# Patient Record
Sex: Female | Born: 1950 | Race: White | Hispanic: No | Marital: Married | Smoking: Never smoker
Health system: Southern US, Community
[De-identification: ages and names within clinical notes are randomized; demographics above are authoritative.]

## PROBLEM LIST (undated history)

## (undated) DIAGNOSIS — K429 Umbilical hernia without obstruction or gangrene: Secondary | ICD-10-CM

## (undated) DIAGNOSIS — E039 Hypothyroidism, unspecified: Secondary | ICD-10-CM

## (undated) DIAGNOSIS — J45909 Unspecified asthma, uncomplicated: Secondary | ICD-10-CM

## (undated) DIAGNOSIS — F329 Major depressive disorder, single episode, unspecified: Secondary | ICD-10-CM

## (undated) DIAGNOSIS — F419 Anxiety disorder, unspecified: Secondary | ICD-10-CM

## (undated) DIAGNOSIS — D589 Hereditary hemolytic anemia, unspecified: Secondary | ICD-10-CM

## (undated) DIAGNOSIS — K219 Gastro-esophageal reflux disease without esophagitis: Secondary | ICD-10-CM

## (undated) DIAGNOSIS — F32A Depression, unspecified: Secondary | ICD-10-CM

## (undated) DIAGNOSIS — C801 Malignant (primary) neoplasm, unspecified: Secondary | ICD-10-CM

## (undated) DIAGNOSIS — F319 Bipolar disorder, unspecified: Secondary | ICD-10-CM

## (undated) DIAGNOSIS — D649 Anemia, unspecified: Secondary | ICD-10-CM

## (undated) DIAGNOSIS — Z9289 Personal history of other medical treatment: Secondary | ICD-10-CM

## (undated) DIAGNOSIS — G4730 Sleep apnea, unspecified: Secondary | ICD-10-CM

## (undated) DIAGNOSIS — M549 Dorsalgia, unspecified: Secondary | ICD-10-CM

## (undated) DIAGNOSIS — J189 Pneumonia, unspecified organism: Secondary | ICD-10-CM

## (undated) DIAGNOSIS — R600 Localized edema: Secondary | ICD-10-CM

## (undated) DIAGNOSIS — K829 Disease of gallbladder, unspecified: Secondary | ICD-10-CM

## (undated) HISTORY — DX: Umbilical hernia without obstruction or gangrene: K42.9

## (undated) HISTORY — DX: Bipolar disorder, unspecified: F31.9

## (undated) HISTORY — PX: BREAST CYST EXCISION: SHX579

## (undated) HISTORY — PX: OTHER SURGICAL HISTORY: SHX169

## (undated) HISTORY — DX: Dorsalgia, unspecified: M54.9

## (undated) HISTORY — PX: TUBAL LIGATION: SHX77

## (undated) HISTORY — PX: BREAST SURGERY: SHX581

## (undated) HISTORY — DX: Major depressive disorder, single episode, unspecified: F32.9

## (undated) HISTORY — DX: Gastro-esophageal reflux disease without esophagitis: K21.9

## (undated) HISTORY — DX: Sleep apnea, unspecified: G47.30

## (undated) HISTORY — DX: Anxiety disorder, unspecified: F41.9

## (undated) HISTORY — DX: Localized edema: R60.0

## (undated) HISTORY — DX: Depression, unspecified: F32.A

## (undated) HISTORY — DX: Anemia, unspecified: D64.9

## (undated) HISTORY — PX: JOINT REPLACEMENT: SHX530

## (undated) HISTORY — DX: Malignant (primary) neoplasm, unspecified: C80.1

## (undated) HISTORY — DX: Disease of gallbladder, unspecified: K82.9

## (undated) HISTORY — DX: Hypothyroidism, unspecified: E03.9

---

## 1975-10-23 HISTORY — PX: OTHER SURGICAL HISTORY: SHX169

## 2002-07-14 ENCOUNTER — Encounter: Admission: RE | Admit: 2002-07-14 | Discharge: 2002-07-14 | Payer: Self-pay | Admitting: Family Medicine

## 2002-07-14 ENCOUNTER — Encounter: Payer: Self-pay | Admitting: Family Medicine

## 2003-06-22 ENCOUNTER — Encounter: Admission: RE | Admit: 2003-06-22 | Discharge: 2003-06-22 | Payer: Self-pay | Admitting: Family Medicine

## 2003-06-22 ENCOUNTER — Encounter: Payer: Self-pay | Admitting: Family Medicine

## 2004-06-02 ENCOUNTER — Emergency Department (HOSPITAL_COMMUNITY): Admission: EM | Admit: 2004-06-02 | Discharge: 2004-06-03 | Payer: Self-pay | Admitting: Emergency Medicine

## 2004-09-15 ENCOUNTER — Encounter: Admission: RE | Admit: 2004-09-15 | Discharge: 2004-09-15 | Payer: Self-pay | Admitting: Family Medicine

## 2005-02-13 ENCOUNTER — Encounter: Admission: RE | Admit: 2005-02-13 | Discharge: 2005-02-13 | Payer: Self-pay | Admitting: Family Medicine

## 2005-02-19 ENCOUNTER — Other Ambulatory Visit: Admission: RE | Admit: 2005-02-19 | Discharge: 2005-02-19 | Payer: Self-pay | Admitting: Family Medicine

## 2005-08-06 ENCOUNTER — Ambulatory Visit (HOSPITAL_COMMUNITY): Admission: RE | Admit: 2005-08-06 | Discharge: 2005-08-06 | Payer: Self-pay | Admitting: Orthopedic Surgery

## 2005-08-06 ENCOUNTER — Ambulatory Visit (HOSPITAL_BASED_OUTPATIENT_CLINIC_OR_DEPARTMENT_OTHER): Admission: RE | Admit: 2005-08-06 | Discharge: 2005-08-06 | Payer: Self-pay | Admitting: Orthopedic Surgery

## 2006-07-17 ENCOUNTER — Emergency Department (HOSPITAL_COMMUNITY): Admission: EM | Admit: 2006-07-17 | Discharge: 2006-07-18 | Payer: Self-pay | Admitting: Emergency Medicine

## 2006-09-05 ENCOUNTER — Other Ambulatory Visit: Admission: RE | Admit: 2006-09-05 | Discharge: 2006-09-05 | Payer: Self-pay | Admitting: Family Medicine

## 2008-02-16 ENCOUNTER — Encounter: Admission: RE | Admit: 2008-02-16 | Discharge: 2008-02-16 | Payer: Self-pay | Admitting: Family Medicine

## 2008-04-26 ENCOUNTER — Inpatient Hospital Stay (HOSPITAL_COMMUNITY): Admission: RE | Admit: 2008-04-26 | Discharge: 2008-05-01 | Payer: Self-pay | Admitting: Orthopedic Surgery

## 2008-11-22 ENCOUNTER — Inpatient Hospital Stay (HOSPITAL_COMMUNITY): Admission: RE | Admit: 2008-11-22 | Discharge: 2008-11-26 | Payer: Self-pay | Admitting: Orthopedic Surgery

## 2009-03-31 ENCOUNTER — Other Ambulatory Visit: Admission: RE | Admit: 2009-03-31 | Discharge: 2009-03-31 | Payer: Self-pay | Admitting: Family Medicine

## 2009-04-14 ENCOUNTER — Encounter: Admission: RE | Admit: 2009-04-14 | Discharge: 2009-04-14 | Payer: Self-pay | Admitting: Family Medicine

## 2010-11-12 ENCOUNTER — Encounter: Payer: Self-pay | Admitting: Family Medicine

## 2011-02-05 LAB — DIFFERENTIAL
Basophils Absolute: 0 10*3/uL (ref 0.0–0.1)
Basophils Relative: 1 % (ref 0–1)
Eosinophils Absolute: 0.1 10*3/uL (ref 0.0–0.7)
Eosinophils Relative: 1 % (ref 0–5)
Lymphocytes Relative: 37 % (ref 12–46)
Lymphs Abs: 2.2 10*3/uL (ref 0.7–4.0)
Monocytes Absolute: 0.4 10*3/uL (ref 0.1–1.0)
Monocytes Relative: 8 % (ref 3–12)
Neutro Abs: 3.2 10*3/uL (ref 1.7–7.7)
Neutrophils Relative %: 55 % (ref 43–77)

## 2011-02-05 LAB — URINALYSIS, ROUTINE W REFLEX MICROSCOPIC
Glucose, UA: NEGATIVE mg/dL
Hgb urine dipstick: NEGATIVE
Ketones, ur: 15 mg/dL — AB
Nitrite: NEGATIVE
Protein, ur: 30 mg/dL — AB
Specific Gravity, Urine: 1.046 — ABNORMAL HIGH (ref 1.005–1.030)
Urobilinogen, UA: 1 mg/dL (ref 0.0–1.0)
pH: 6 (ref 5.0–8.0)

## 2011-02-05 LAB — BASIC METABOLIC PANEL
BUN: 27 mg/dL — ABNORMAL HIGH (ref 6–23)
CO2: 28 mEq/L (ref 19–32)
Calcium: 9.1 mg/dL (ref 8.4–10.5)
Chloride: 101 mEq/L (ref 96–112)
Creatinine, Ser: 0.74 mg/dL (ref 0.4–1.2)
GFR calc Af Amer: >60 mL/min (ref >60)
GFR calc non Af Amer: >60 mL/min (ref >60)
Glucose, Bld: 100 mg/dL — ABNORMAL HIGH (ref 70–99)
Potassium: 4.8 mEq/L (ref 3.5–5.1)
Sodium: 139 mEq/L (ref 135–145)

## 2011-02-05 LAB — TYPE AND SCREEN
ABO/RH(D): A POS
Antibody Screen: NEGATIVE

## 2011-02-05 LAB — APTT: aPTT: 28 seconds (ref 24–37)

## 2011-02-05 LAB — CBC
HCT: 38.8 % (ref 36.0–46.0)
Hemoglobin: 12.9 g/dL (ref 12.0–15.0)
MCHC: 33.3 g/dL (ref 30.0–36.0)
MCV: 95 fL (ref 78.0–100.0)
Platelets: 253 10*3/uL (ref 150–400)
RBC: 4.09 MIL/uL (ref 3.87–5.11)
RDW: 13.4 % (ref 11.5–15.5)
WBC: 5.9 10*3/uL (ref 4.0–10.5)

## 2011-02-05 LAB — URINE MICROSCOPIC-ADD ON

## 2011-02-05 LAB — PROTIME-INR
INR: 1 (ref 0.00–1.49)
Prothrombin Time: 12.8 seconds (ref 11.6–15.2)

## 2011-02-06 LAB — BASIC METABOLIC PANEL
BUN: 10 mg/dL (ref 6–23)
BUN: 7 mg/dL (ref 6–23)
BUN: 9 mg/dL (ref 6–23)
CO2: 31 mEq/L (ref 19–32)
CO2: 32 mEq/L (ref 19–32)
CO2: 32 mEq/L (ref 19–32)
Calcium: 8.1 mg/dL — ABNORMAL LOW (ref 8.4–10.5)
Calcium: 8.5 mg/dL (ref 8.4–10.5)
Chloride: 102 mEq/L (ref 96–112)
Chloride: 102 mEq/L (ref 96–112)
Chloride: 94 mEq/L — ABNORMAL LOW (ref 96–112)
Creatinine, Ser: 0.52 mg/dL (ref 0.4–1.2)
Creatinine, Ser: 0.69 mg/dL (ref 0.4–1.2)
GFR calc Af Amer: >60 mL/min (ref >60)
GFR calc Af Amer: >60 mL/min (ref >60)
GFR calc non Af Amer: >60 mL/min (ref >60)
GFR calc non Af Amer: >60 mL/min (ref >60)
Glucose, Bld: 107 mg/dL — ABNORMAL HIGH (ref 70–99)
Glucose, Bld: 118 mg/dL — ABNORMAL HIGH (ref 70–99)
Glucose, Bld: 168 mg/dL — ABNORMAL HIGH (ref 70–99)
Potassium: 3.4 mEq/L — ABNORMAL LOW (ref 3.5–5.1)
Potassium: 3.8 mEq/L (ref 3.5–5.1)
Sodium: 134 mEq/L — ABNORMAL LOW (ref 135–145)
Sodium: 139 mEq/L (ref 135–145)

## 2011-02-06 LAB — CROSSMATCH: ABO/RH(D): A POS

## 2011-02-06 LAB — GLUCOSE, CAPILLARY
Glucose-Capillary: 157 mg/dL — ABNORMAL HIGH (ref 70–99)
Glucose-Capillary: 175 mg/dL — ABNORMAL HIGH (ref 70–99)

## 2011-02-06 LAB — BLOOD GAS, ARTERIAL
Acid-Base Excess: 2.5 mmol/L — ABNORMAL HIGH (ref 0.0–2.0)
Bicarbonate: 27.9 mEq/L — ABNORMAL HIGH (ref 20.0–24.0)
Drawn by: 27605
FIO2: 1 %
O2 Saturation: 100 %
Patient temperature: 98.6
TCO2: 29.6 mmol/L (ref 0–100)
pCO2 arterial: 54.2 mmHg — ABNORMAL HIGH (ref 35.0–45.0)
pH, Arterial: 7.332 — ABNORMAL LOW (ref 7.350–7.400)
pO2, Arterial: 300 mmHg — ABNORMAL HIGH (ref 80.0–100.0)

## 2011-02-06 LAB — CBC
HCT: 24 % — ABNORMAL LOW (ref 36.0–46.0)
HCT: 26 % — ABNORMAL LOW (ref 36.0–46.0)
HCT: 37.2 % (ref 36.0–46.0)
Hemoglobin: 10.4 g/dL — ABNORMAL LOW (ref 12.0–15.0)
Hemoglobin: 12.2 g/dL (ref 12.0–15.0)
Hemoglobin: 8.4 g/dL — ABNORMAL LOW (ref 12.0–15.0)
Hemoglobin: 9.1 g/dL — ABNORMAL LOW (ref 12.0–15.0)
MCHC: 32.9 g/dL (ref 30.0–36.0)
MCHC: 35.2 g/dL (ref 30.0–36.0)
MCV: 94.9 fL (ref 78.0–100.0)
MCV: 95.8 fL (ref 78.0–100.0)
MCV: 95.9 fL (ref 78.0–100.0)
Platelets: 200 10*3/uL (ref 150–400)
Platelets: 256 10*3/uL (ref 150–400)
RBC: 2.74 MIL/uL — ABNORMAL LOW (ref 3.87–5.11)
RBC: 3.25 MIL/uL — ABNORMAL LOW (ref 3.87–5.11)
RBC: 3.88 MIL/uL (ref 3.87–5.11)
RDW: 13.2 % (ref 11.5–15.5)
RDW: 13.5 % (ref 11.5–15.5)
WBC: 7.5 10*3/uL (ref 4.0–10.5)
WBC: 8.1 10*3/uL (ref 4.0–10.5)

## 2011-02-06 LAB — PROTIME-INR
INR: 1.1 (ref 0.00–1.49)
INR: 2.4 — ABNORMAL HIGH (ref 0.00–1.49)
Prothrombin Time: 14 seconds (ref 11.6–15.2)
Prothrombin Time: 27.4 seconds — ABNORMAL HIGH (ref 11.6–15.2)

## 2011-02-06 LAB — CARDIAC PANEL(CRET KIN+CKTOT+MB+TROPI)
CK, MB: 3.8 ng/mL (ref 0.3–4.0)
Total CK: 465 U/L — ABNORMAL HIGH (ref 7–177)
Total CK: 649 U/L — ABNORMAL HIGH (ref 7–177)
Total CK: 874 U/L — ABNORMAL HIGH (ref 7–177)
Troponin I: 0.02 ng/mL (ref 0.00–0.06)

## 2011-02-06 LAB — COMPREHENSIVE METABOLIC PANEL
Alkaline Phosphatase: 45 U/L (ref 39–117)
BUN: 9 mg/dL (ref 6–23)
CO2: 27 mEq/L (ref 19–32)
GFR calc non Af Amer: >60 mL/min (ref >60)
Glucose, Bld: 143 mg/dL — ABNORMAL HIGH (ref 70–99)
Potassium: 3.7 mEq/L (ref 3.5–5.1)
Total Protein: 5.7 g/dL — ABNORMAL LOW (ref 6.0–8.3)

## 2011-02-06 LAB — BRAIN NATRIURETIC PEPTIDE: Pro B Natriuretic peptide (BNP): 43 pg/mL (ref 0.0–100.0)

## 2011-02-20 HISTORY — PX: MELANOMA EXCISION: SHX5266

## 2011-03-06 NOTE — Consult Note (Signed)
NAME:  Yolanda, Chavez              ACCOUNT NO.:  [id number]   MEDICAL RECORD NO.:  [id number]          PATIENT TYPE:  INP   LOCATION:  5028                         FACILITY:  [facility]   PHYSICIAN:  Ramiro Harvest, MD    DATE OF BIRTH:  [birth year]   DATE OF CONSULTATION:  11/24/2008  DATE OF DISCHARGE:                                 CONSULTATION   INTERNAL MEDICINE CONSULTATION   REASON FOR CONSULTATION:  Respiratory distress, desaturations and  lethargy.   HISTORY OF PRESENT ILLNESS:  Yolanda Chavez is a 60 year old white  female with history of gastroesophageal reflux disease, hiatal hernia,  hypothyroidism, arthritis, who was admitted on November 22, 2008 for a  right total knee arthroplasty per Dr. Turner Daniels.  After surgery, the patient  was noted to desaturate and was lethargic, which was felt to be  secondary to Dilaudid and anesthesia and responded adequately to Narcan.  On November 24, 2008, per nursing, the patient was difficult to arouse,  had desaturated to 84% on room air with systolic blood pressures of  160/70, heart rate in the low 100s, however, the patient was able to  answer questions appropriately.  When PT/OT worked with the patient, the  patient was noted to desaturate in the 80s with a heart rate in the 160s  and was lethargic.  The patient had been receiving Percocet the day  before, on November 23, 2008 and had previously been on a PCA pump.  The  patient's last dose of Percocet was approximately 10 P.M.  The patient  was given some Narcan again, 0.2 with improvement in her symptoms and  improvement in her saturations and her lethargy and was back to her  baseline by the time of this interview.  We were consulted for further  evaluation and recommendations.  The patient denied any fevers, no  chills, no chest pain, no shortness of breath, no nausea, no vomiting,  no abdominal pain, no diarrhea, no constipation, no focal neurological  symptoms, no cough, no dysuria,  no melena, no hematemesis, no  hematochezia, no other associated symptoms.  The patient was alert and  answering questions and following commands appropriately.   PRIMARY CARE PHYSICIAN:  Dr. Foy Guadalajara of [facility].   ALLERGIES:  No known drug allergies.   PAST MEDICAL HISTORY:  1. Panic attacks.  2. Arthritis.  3. Hiatal hernia.  4. Gastroesophageal reflux disease.  5. Bipolar disorder.  6. Depression.  7. Hypothyroidism.  8. Status post left knee arthroscopy in 2006.  9. Right thumb surgery in 1970s.  10.Cataract, status post surgery, December of 2009.   HOME MEDICATIONS:  1. Tylenol ES 2tablets p.o. b.i.d.  2. Advil 2 tabs p.o. b.i.d.  3. Seroquel 75 mg p.o. nightly.  4. Nexium 40 mg p.r.n.  5. Clomipramine 50 mg q.h.s.  6. Levothyroxine 50 mcg p.o. daily.  7. Depakote 1000 mg p.o. q.h.s.  8. Clonazepam 1.5 mg b.i.d.  9. Arthrotec 75 mg p.o. b.i.d.   HOSPITAL MEDICATIONS:  1. Clomipramine 50 mg p.o. q.h.s.  2. Clonazepam 3 mg p.o. b.i.d.  3. D5 1/2 normal saline 125  mL per hour.  4. Depakote 1 gram nightly.  5. Colace 100 mg p.o. b.i.d.  6. Levothyroxine 50 mcg p.o. daily.  7. Protonix 80 mg p.o. nightly.  8. Seroquel 150 mg p.o. b.i.d.  9. Senna one tablet p.o. b.i.d.  10.Coumadin.   SOCIAL HISTORY:  The patient is married, is retired, is a Environmental consultant at a [employer].  No tobacco use. No alcohol  use.  No IV drug use.   FAMILY HISTORY:  Father with a family history pertinent for  hypertension, coronary artery disease and diabetes.  Has a son with  asthma.   REVIEW OF SYSTEMS:  As per HPI, otherwise negative.   PHYSICAL EXAMINATION:  VITAL SIGNS:  Temperature 99.1, pulse 96, blood  pressure 116/69, respiratory rate 18, saturating 99% on 2L nasal  cannula.  GENERAL:  Patient is alert and following commands appropriately.  HEENT:  Normocephalic, atraumatic.  Pupils equal, round, reactive to  light and accommodation.  Extraocular  movements intact. Oropharynx is  clear.  No lesions, no exudates.  Mildly dry mucous membranes.  RESPIRATORY:  Lungs are clear to auscultation bilaterally.  CARDIOVASCULAR:  Tachycardic, regular rhythm.  ABDOMEN:  Soft, nontender,  nondistended.  Positive bowel sounds.  EXTREMITIES:  No cyanosis, clubbing or edema.  NEUROLOGICAL:  Patient is alert and oriented x3.  Cranial nerves II-XII  are grossly intact.  No focal deficits,  3-4/5 bilateral lower extremity  strength, 5/5 bilateral upper extremity strength.  Cerebellum is intact.  Gait not tested.   LABORATORY DATA:  Pro Time 27.4, INR of 2.4. CBC with white count 9.3,  hemoglobin 10.4, platelet count 225, 000, hematocrit 31.0. EKG with  normal sinus rhythm.  Chest x-ray:  Minimal left basilar atelectasis.  Negative for air space disease or pulmonary edema.   ASSESSMENT AND PLAN:  Yolanda Chavez is a 60 year old female with  history of gastroesophageal reflux disease, hypothyroidism, bipolar  disorder, anxiety, depression who was admitted for right total knee  arthroplasty and who we have consulted for respiratory  distress/lethargy.  1. Respiratory distress/lethargy.  Likely secondary to opioid inducted      versus increased dose of Seroquel and anxiolytics versus pulmonary      embolus (which is unlikely with a therapeutic INR and patient is      asymptomatic) versus pneumonia, which is unlikely as the patient is      afebrile with normal white count, chest x-ray is negative versus      congestive heart failure, which is unlikely with a normal chest x-      ray and patient is asymptomatic.  Will check a comprehensive      metabolic profile, check a BNP, cardiac enzymes.  Will discontinue      the patient's opioids.  Will decrease her Seroquel to her home dose      of 75 mg q.h.s.  Will also decrease her clonazepam to 1.5 mg p.o.      b.i.d.  Will place her on scheduled Tylenol and Ultram as needed      and will follow.  2. Mild  dehydration.  IV fluids.  3. Right total knee arthroplasty per primary team.  4. Anemia, likely a postoperative anemia.  Will follow for now.   It has been a pleasure taking care of Ms. Yolanda Chavez.      Ramiro Harvest, MD  Electronically Signed     DT/MEDQ  D:  11/24/2008  T:  11/25/2008  Job:  [id number]  cc:   Molly Maduro L. Foy Guadalajara, M.D.

## 2011-03-06 NOTE — Op Note (Signed)
NAMEGLENETTA, KIGER              ACCOUNT NO.:  [id number]   MEDICAL RECORD NO.:  [id number]          PATIENT TYPE:  INP   LOCATION:  2899                         FACILITY:  [facility]   PHYSICIAN:  Feliberto Gottron. Turner Daniels, M.D.   DATE OF BIRTH:  Jul 08, 1951   DATE OF PROCEDURE:  11/22/2008  DATE OF DISCHARGE:                               OPERATIVE REPORT   PREOPERATIVE DIAGNOSIS:  End-stage arthritis, right knee.   POSTOPERATIVE DIAGNOSIS:  End-stage arthritis, right knee.   PROCEDURE:  Right total knee arthroplasty using DePuy Sigma RP  components all cemented, a 3 right femur, 2.5 tibia, 10-mm Sigma RP  bearing, 38-mm patellar button.   SURGEON:  Feliberto Gottron. Turner Daniels, MD   FIRST ASSISTANT:  Shirl Harris, PA-C   ANESTHESIA:  Femoral nerve block plus general endotracheal.   ESTIMATED BLOOD LOSS:  Minimal.   FLUID REPLACEMENT:  1500 mL of crystalloid.   DRAINS PLACED:  Foley catheter and 2 medium Hemovacs.   URINE OUTPUT:  300 mL.   TOURNIQUET TIME:  1 hour and 30 minutes.   INDICATIONS FOR PROCEDURE:  A 60 year old woman who has previously had  left total knee a while back and this has done very well, has end-stage  arthritis of the right knee, especially the patellofemoral compartment  where she has bony erosion.  She desires elective right total knee  arthroplasty to decrease pain and increase function.  Risks and benefits  of surgery discussed and are well understood by the patient.   DESCRIPTION OF PROCEDURE:  The patient identified by armband and  underwent right femoral nerve block anesthetic in the holding area at  [facility].  She also received preoperative IV antibiotics and was  taken to operating room 1 where the appropriate anesthetic monitors were  reattached and general endotracheal anesthesia induced with the patient  in supine position.  A Foley catheter was inserted.  Tourniquet applied  high to the right thigh and the right lower extremity prepped and draped  in the usual sterile fashion from the ankle to the tourniquet after  placing a foot positioner and a lateral post.  The limb was wrapped with  an Esmarch bandage and a standard time-out procedure was performed.  The  tourniquet was inflated to 350 mmHg.  We began the operation itself by  making the anterior midline incision centered over the patella starting  a handbreadth above the patella over the midline of the patella and  going 1 cm medial to and 2 cm distal to the tibial tubercle cutting  through the skin and subcutaneous tissue down the level of transverse  retinaculum which was incised in line with the skin incision.  A medial  parapatellar arthrotomy was then accomplished.  Patella was everted.  Prepatellar fat pad was resected.  Superficial medial collateral  ligament was elevated off the anterior, medial, and proximal tibia going  from anterior to posterior and leaving intact distally.  This allowed [name]  to externally rotate the tibia and bring the leg into hyperflexion with  the patella everted, large peripheral osteophytes were removed from  around the femur  and the patella as well as the notch which was quite  stenotic.  The ACL and PCL were then resected.  Posteromedial Z  retractor was placed.  A Mchale retractor through the notch and a Homan  retractor laterally.  The knee was hyperflexed exposing the proximal  tibia which was then entered in the center of the tibia along the axis  of the tibial shaft with the DePuy step drill followed by the IM rod and  a 2-degree posterior sloped proximal tibial cutting guide which was set  for a fairly generous cut because of a 10-15 degrees flexion  contracture.  We removed probably about 10 mm of bone medially and  laterally.  At this point, we entered the distal femur with the step  drill 2 mm anterior to the PCL origin, followed by the IM rod set for 5  degrees right distal femoral cut at 11 mm.  This was pinned into place  and the  distal femoral cut accomplished.  We then measured for a #3  femoral component same size as the contralateral side and pinned this in  place in 0 degrees of external rotation because there was no significant  varus deformity.  A #3 chamfer cutting guide was then screwed into place  over the pins and the anterior, posterior, and chamfer cuts accomplished  without difficulty followed by the Sigma RP box cut.  The patella was  quite eroded requiring basically a freehand cut leaving behind about  probably 14 or 15 mm of bone and a good surface for cementing.  We sized  for a 38 button and drilled the trial.  At this point, the knee was a  hyperflexed.  We sized for a 2.5 tibial baseplate, pinned the trial in  place, applied the smokestack and the reamer, followed by the Delta fin  keel punch.  A #3 left trial femoral component was then hammered into  place, the lugs drilled, and we performed a trial reduction with a 10-mm  spacer.  The knee did come to full extension with good ligamentous  stability in extension, mid flexion, and flexion.  The patella was  subluxing laterally some and because of this a lateral release was  performed and the patella then tracked with no thumb pressure and the  lateral retinaculum was intact.  At this point, all bony surfaces were  water picked, cleaned, and dried with suction and sponges and a double  batch of DePuy HV cement with 1500 mg of Zinacef was mixed and applied  to all bony metallic mating surfaces and in order we hammered into place  a 2.5 tibial baseplate and removed the excess cement, a 3 right femur  and removed excess cement.  A 10-mm Sigma RP bearing was then inserted.  The knee brought to full extension and compression applied as the cement  cured.  The 38-mm patellar button was squeezed into place and excess  cement removed and again the cement allowed to cure.  Medium Hemovac  drains were placed from anterolateral approach and at this point,  the  knee was irrigated out with normal saline solution one more time.  We  checked our patellar tracking one more time.  Parapatellar arthrotomy  was then closed with running #1 Vicryl suture, the subcutaneous tissue  with 0 and 2-0 undyed Vicryl suture and the skin with skin staples.  Dressing of Xeroform, 4 x 4s dressing, sponges, Webril, and Ace wrap  applied.  The tourniquet was let  down.  The patient was then awakened  and taken to the recovery room without difficulty.      Feliberto Gottron. Turner Daniels, M.D.  Electronically Signed     FJR/MEDQ  D:  11/22/2008  T:  11/22/2008  Job:  [id number]

## 2011-03-06 NOTE — Op Note (Signed)
NAME:  Yolanda Chavez, Yolanda Chavez              ACCOUNT NO.:  [id number]   MEDICAL RECORD NO.:  [id number]          PATIENT TYPE:  INP   LOCATION:  5009                         FACILITY:  [facility]   PHYSICIAN:  Feliberto Gottron. Turner Daniels, M.D.   DATE OF BIRTH:  1951/09/25   DATE OF PROCEDURE:  04/26/2008  DATE OF DISCHARGE:                               OPERATIVE REPORT   PREOPERATIVE DIAGNOSIS:  End-stage arthritis of the left knee.   POSTOPERATIVE DIAGNOSIS:  End-stage arthritis of the left knee.   PROCEDURE:  Left total knee arthroplasty using DePuy Sigma RP  components; all cemented, 3 femur, 3 tibia, 10 PS spacer, 38-mm patellar  button, and double batch of DePuy HV cement with 1500 mg of Zinacef.   SURGEON:  Feliberto Gottron. Turner Daniels, MD.   FIRST ASSISTANT:  Shirl Harris, PA-C.   ANESTHETIC:  General endotracheal.   ESTIMATED BLOOD LOSS:  Minimal.   FLUID REPLACEMENT:  800 mL of crystalloid.   DRAINS PLACED:  Two medium Hemovac and a Foley catheter.   TOURNIQUET TIME:  1 hour and 49 minutes.   INDICATIONS FOR PROCEDURE:  A 60 year old woman with end-stage arthritis  of the left knee with a 20-degree flexion contracture and not too much  in the way of varus or valgus deformity.  Arthritis was mainly  patellofemoral and less so in the medial and lateral compartments,  although she is down to bare bone pretty much everywhere.  She has  failed conservative treatment and anti-inflammatory medicines, physical  therapy, narcotics, and desires elective left total knee arthroplasty.  Risks and benefits of surgery discussed, questions answered.   DESCRIPTION OF PROCEDURE:  The patient was identified by armband and in  the preop area received a femoral nerve block left, as well as 2 g of  Ancef IV.  She was then taken to operating room #10 where the  appropriate anesthetic monitors were attached, and general endotracheal  anesthesia induced to the patient in the supine position.  A Foley  catheter was  inserted.  Tourniquet applied high over the left thigh.  Lateral post and foot positioner applied to the table and the left lower  extremity was prepped and draped in usual sterile fashion from the ankle  to the tourniquet.  The limb was then wrapped with an Esmarch bandage,  tourniquet inflated 350 mmHg, and a standard time-out procedure was then  performed.  We then made an anterior midline incision starting  handbreadth above the patella going over the patella 1 cm medial to and  3 cm distal to the tibial tubercle through the skin and subcutaneous  tissue.  Small bleeders were identified and cauterized.  We then incised  the transverse retinaculum over the patella, reflected medially, and  performed a medial parapatellar arthrotomy.  Prepatellar fat pad was  resected.  The superficial medial collateral ligament was elevated off  the proximal tibia from anterior to posterior and then stripped down for  about 3-4 cm.  The patella was then everted, the knee was hyperflexed  fairly impressive peripheral osteophytes from the femur, and the femoral  notch were then  removed as well as osteophytes from the patella and a  couple of fairly good-sized loose bodies coming out of the knee itself.  The knee was then hyperflexed.  The cruciate ligaments were resected.  The anterior one-half of the menisci were resected and the tibia  translated anteriorly.  We used a posteromedial Z-retractor, a Hohmann  retracted laterally, and a McGill retractor through the notch, entered  the proximal tibia with a step drill followed by the IM rod and a 2-  degree posterior slope cutting guide.  We then performed a standard  proximal tibial cut removing an extra 2 mm of bone for about 10 mm in  total because of the flexion contracture.  This was done with the  intramedullary rod and a 2-degree posterior slope cutting guide was then  placed and the IM rod was removed after the guide was pinned.  We then  directed  our attention to the femur, entered the femur 2 mm anterior to  the PCL origin with the intramedullary rod, and 5-degree left distal  femoral cutting guide set for 12 mm.  This was pinned into place and the  distal femoral cut accomplished.  We sized for #3 femoral component  because of the varus even medially and laterally.  We put no rotation  into the pinning and then pinned into place of the anterior/posterior  and chamfer cutting block.  The anterior/posterior and chamfer cuts were  accomplished without difficulty.  Some posterior and superior  osteophytes were then removed off the femur with an osteotome as well as  some more loose bodies.  The box cut was then accomplished without  difficulty.  The patella was measured at 20 mm that had a fair amount of  erosion, so we set the cutting guide at 14 to remove the posterior 67 mm  of the patella as well as peripheral osteophytes sized for 38-mm button  and drilled the patella.  The knee was then once again hyperflexed.  We  sized #3 tibial baseplate, which was pinned into place followed by the  conical reamer and a Deltafit keel punch.  A trial of 3 left femoral  component was then hammered into place and the lugs drilled.  We  performed a trial reduction with a #3 baseplate and a 10 trial PS  spacer, the knee did come to full extension and flexed easily to 135  degrees.  The patellar button tracked normally and then patellar groove  as a trial.  At this point, all trials were removed and the bony  surfaces were water picked clean and dry with suction and sponges.  A  double batch DePuy HV cement with 1500 mg of Zinacef was then mixed and  applied to all bony metallic mating surfaces, except for the posterior  condyles of the femur itself.  In order, we hammered and placed a #3  tibial baseplate removed excess cement, a 3 left femoral component  removed excess cement, and a 38-mm patellar button removed the excess  cement.  As the  cement cured, it was noticed there was some wiggle  between the tibial base plates and the cement mantle with some fluid  coming out and the femur was already hard at this point and the patella  was getting harder at this, so we went ahead and extracted the tibial  component, which did have fluid between and the cement at this point.  We elected to revise the tibial component at this point and  using a  power saw removed a thin layer of cement.  We reamed to the conical  reamer and the Deltafit keel punch and then dried the tibia once again  with suction and sponges.  A single batch of DePuy HV cement was then  mixed, applied to all bony metallic mating surfaces of tibial baseplate,  and a Lonni Fix was hammered in place of being careful to keep the posterior  aspect of the baseplate clean.  After this cement hardened, there was no  toggle noted.  A new 10-mm PS spacer was then hammered, snapped into  place, and the knee once again reduced and taken through range of motion  with excellent tracking.  Medium Hemovac drains were then placed deep in  the wound, which was once again water picked clean.  Parapatellar  arthrotomy was closed with running #1 Vicryl suture.  The subcutaneous  tissue with 0 and 2-0 undyed Vicryl suture and the skin with skin  staples.  A dressing of Xeroform, 4x4 dressing, sponges, Webril, and Ace  wrap was then applied.  The tourniquet was let down.  The patient was  awakened and taken to the recovery room without difficulty.      Feliberto Gottron. Turner Daniels, M.D.  Electronically Signed     FJR/MEDQ  D:  04/26/2008  T:  04/26/2008  Job:  [id number]

## 2011-03-09 NOTE — Discharge Summary (Signed)
NAME:  RYELEIGH, Yolanda Chavez              ACCOUNT NO.:  [id number]   MEDICAL RECORD NO.:  [id number]          PATIENT TYPE:  INP   LOCATION:  5009                         FACILITY:  [facility]   PHYSICIAN:  Feliberto Gottron. Turner Daniels, M.D.   DATE OF BIRTH:  January 17, 1951   DATE OF ADMISSION:  04/26/2008  DATE OF DISCHARGE:  05/01/2008                               DISCHARGE SUMMARY   CHIEF COMPLAINT:  Left knee pain.   HISTORY OF PRESENT ILLNESS:  This is a 60 year old lady who complaints  of unremitting left knee pain despite conservative treatment with  nonsteroidal antiinflammatory drugs, steroid injection, and knee  arthroscopy.  She desires a surgical intervention at this time.  All  risks and benefit of surgery were discussed with the patient.   PAST MEDICAL HISTORY:  Significant for bipolar disorder, hypothyroidism,  and gastroesophageal reflux disease.   PAST SURGICAL HISTORY:  She had a left knee arthroscopy in 2006 and  right thumb surgery in the 1970s.   SOCIAL HISTORY:  She does not drink alcohol.  She is a nonsmoker.  She  is married.   FAMILY HISTORY:  Significant for coronary artery disease and diabetes  mellitus in her father.   ALLERGIES:  She has no known drug allergies.   CURRENT MEDICATIONS:  1. Tylenol 650 mg 1 p.o. daily p.r.n. pain.  2. Imipramine 25 mg 2 tablets p.o. b.i.d.  3. Seroquel 50 mg 1 p.o. t.i.d.  4. Levoxyl 50 mcg 1 p.o. daily.  5. Nexium 40 mg 1 p.o. daily.  6. Valproate sodium 500 mg 2 p.o. in a.m. and 1 p.o. nightly.  7. Arthrotec 75 mg 1 p.o. b.i.d.  8. Clonazepam 1 mg 1 p.o. q.i.d. p.r.n.   PHYSICAL EXAMINATION:  Gross examination of the left knee demonstrates  the patient's range of motion to be 10-90 degrees.  She has a laterally  riding patella and patellofemoral crepitance.  She is neurovascularly  intact.   X-RAYS:  Film from radiograph shows the left knee demonstrates severe  tricompartmental arthritis with extensive spurring in the patellofemoral  compartment.   PREOPERATIVE LABS:  White blood cell 6.5, red blood cell 4.15,  hemoglobin 13.4, hematocrit 39.2, platelets 269, PT 11.9, INR 0.9, and  PTT 28.  Sodium 138, potassium 4.2, chloride 101, glucose 90, BUN 16,  and creatinine 0.72.   Ms. Snethen was admitted to [facility] on April 26, 2008, when she  underwent a left total knee arthroplasty using a DePuy  system.  A  perioperative Foley catheter was placed.  She tolerated the procedure  well and was transferred to the orthopedic floor.  On the first  postoperative day, the patient was awake and alert and tolerating p.o.  intake.  She denied any nausea or vomiting.  Her hemoglobin was found to  be 11.6.  Her Foley catheter was removed.  On the second postoperative  day, the patient was again awake and alert.  Her flexion with the CPM  machine was measured to be 40 degrees.  Her hemoglobin was 10.4.  Her  PCA Dilaudid pump was discontinued.  Physical therapy came  to work with  the patient, but she was unable to participate secondary to grogginess  from her increased pain medication.  On the third postoperative day, the  patient was found to be groggy but arousable and complaining of moderate  left knee pain.  She answered questions appropriately.  She continued to  deny any nausea or vomiting.  Her hemoglobin was 10.1.  Her surgical  dressing was changed.  She walked 10 feet with physical therapy and her  CPM flexion was measured at 50 degrees.  On the fourth postoperative  day, the patient was awake and much more alert.  Her surgical incision  continued to be benign with no evidence of infection.  She walked 12  feet with physical therapy.  On the fifth postoperative day, the patient  was awake and alert, eating well, and ambulating independently with her  walker.  Her CPM machine was set at 60 degrees, and she was discharged  to her home.   DISPOSITION:  The patient was discharged home on May 01, 2008.  [facility] was to manage her Coumadin, physical therapy, and  wound.  Her medications were as per the HMO with the additions of  Percocet 5 mg tablet 1-2 tablets p.o. q.4 h. p.r.n. pain and Coumadin 5  mg tablet to take as directed with a target INR of 1.52.  She will be  weightbearing as tolerated.  She will return to the clinic in 1 week to  see Dr. Turner Daniels.   FINAL DIAGNOSIS:  Left knee end-stage degenerative joint disease.   SECONDARY DIAGNOSIS:  Mild acute blood loss anemia.      Shirl Harris, PA      Feliberto Gottron. Turner Daniels, M.D.  Electronically Signed    JW/MEDQ  D:  05/28/2008  T:  05/29/2008  Job:  [id number]

## 2011-03-09 NOTE — Discharge Summary (Signed)
NAME:  Yolanda Chavez, Yolanda Chavez              ACCOUNT NO.:  [id number]   MEDICAL RECORD NO.:  [id number]          PATIENT TYPE:  INP   LOCATION:  5028                         FACILITY:  [facility]   PHYSICIAN:  Feliberto Gottron. Turner Daniels, M.D.   DATE OF BIRTH:  1950/12/06   DATE OF ADMISSION:  11/22/2008  DATE OF DISCHARGE:  11/26/2008                               DISCHARGE SUMMARY   CHIEF COMPLAINT:  Right knee pain.   HISTORY OF PRESENT ILLNESS:  This is a 60 year old lady who complains of  severe unremitting pain in her right knee despite conservative treatment  with pain medication and steroid injection.  She desires a surgical  intervention at this time.  All risks and benefits of surgery were  discussed with the patient.   PAST MEDICAL HISTORY:  Significant for:  1. Bipolar disorder.  2. GERD.  3. Hypothyroidism.   PAST SURGICAL HISTORY:  1. She has had left knee arthroscopy.  2. Left total knee arthroplasty in 2009.  3. Right thumb surgery.  4. She has also had cataract surgery.   SOCIAL HISTORY:  She denies the use of alcohol or tobacco.  She is  married.   FAMILY HISTORY:  Significant for coronary artery disease and diabetes  mellitus.   ALLERGIES:  She has no known drug allergies.   CURRENT MEDICATIONS:  1. Nexium 40 mg 1 p.o. at bedtime.  2. Clonazepam 10 mg 1 p.o. b.i.d.  3. Tylenol Arthritis 1 p.o. b.i.d.  4. Arthrotec 75 mg 1 p.o. b.i.d.  5. Seroquel 50 mg 1 p.o. b.i.d.  6. Depakote 500 mg 1 p.o. at bedtime.  7. Clomipramine 50 mg 1 p.o. at bedtime.  8. Levoxyl 50 mcg 1 p.o. at bedtime.  9. Hydrocodone 5/500 mg 1 p.o. q.4 h. p.r.n. pain.   PHYSICAL EXAMINATION:  Gross examination of the right knee demonstrates  the patient's range of motion to be approximately 10-90 degrees.  She is  neurovascularly intact.  X-rays demonstrate bone-on-bone degenerative  joint disease of the right knee.   PREOPERATIVE LABORATORIES:  White blood cells 5.9, red blood cells 4.09,  hemoglobin  12.9, hematocrit 38.8, and platelets 253.  PT 12.8, INR 1.0,  and PTT 28.  Sodium 139, potassium 4.8, chloride 101, glucose 100, BUN  27, and creatinine 0.74.  Urinalysis demonstrates small bilirubin with  15 ketones, 30 protein, many bacteria, and hyaline casts.   HOSPITAL COURSE:  [name] was admitted to [facility] on November 22, 2008, where she underwent right total knee arthroplasty.  The  procedure was performed by Dr. Gean Birchwood and the patient tolerated it  well.  Two Hemovac drains were placed into the right knee and  perioperative Foley catheter was placed.  The patient was transferred to  the orthopedic floor and placed on Lovenox and Coumadin for DVT  prophylaxis.  Several hours after surgery, she had decrease oxygen  saturations and became very lethargic.  Her PCA Dilaudid was  discontinued with her mental status and respirations both improved.  On  the first postoperative day, the patient was awake and alert.  Her pain  was fairly well controlled without the PCA.  Hemoglobin was 12.2.  Surgical drain was removed.  She was able to ambulate 50 feet with  physical therapy on the second postoperative day.  The patient was alert  and denied any nausea or vomiting.  She was passing flatus, but not  stool and tolerating p.o. intake well.  Her knee pain was well-  controlled.  Hemoglobin was 10.4.  Surgical dressing was changed and her  incision was benign.  Later that day, she had another episode of  decreased oxygen saturation and somnolence.  Narcan was administered and  an Internal Medicine consult was called, it was determined that her  change in mental status and respiration was likely due to her pain  medication, so her oral pain medicine was changed to tramadol.  On third  postoperative day, the patient was much more alert and was responding  fairly to questioning.  High hemoglobin was 9.1.  Surgical dressing  remained clean and dry.  She was using Tylenol and  tramadol for pain  control.  She was able to ambulate 100 feet with physical therapy.  On  the fourth postoperative day, the patient remained alert.  She was  ambulating well and eating well.  Hemoglobin was 8.4 and the patient  complained of fatigue.  She was transfused with 1 unit of packed red  blood cells and then discharged home.   DISPOSITION:  The patient was discharged home on November 26, 2008.  Home  health would manage her wound, Coumadin and physical therapy.  Should  return to the clinic in 1 week for x-rays and staple removal.   DISCHARGE MEDICINES:  As per the [facility] with the addition of:  1. Tramadol 50 mg 1 p.o. q.4 h. p.r.n. pain.  2. Coumadin to take as directed with a target INR of 1.522.   FINAL DIAGNOSIS:  End-stage degenerative joint disease of the right  knee.   SECONDARY DIAGNOSIS:  Acute blood loss anemia.      Shirl Harris, PA      Feliberto Gottron. Turner Daniels, M.D.  Electronically Signed    JW/MEDQ  D:  01/06/2009  T:  01/07/2009  Job:  [id number]

## 2011-03-09 NOTE — Op Note (Signed)
NAME:  Yolanda Chavez, Yolanda Chavez              ACCOUNT NO.:  [id number]   MEDICAL RECORD NO.:  [id number]          PATIENT TYPE:  AMB   LOCATION:  DSC                          FACILITY:  [facility]   PHYSICIAN:  Feliberto Gottron. Turner Daniels, M.D.   DATE OF BIRTH:  [birth year]   DATE OF PROCEDURE:  08/06/2005  DATE OF DISCHARGE:                                 OPERATIVE REPORT   PREOPERATIVE DIAGNOSIS:  Medial meniscal tear versus chondromalacia medial  femoral condyle of the left knee.   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   PROCEDURE:  Left knee arthroscopic debridement of extensive chondromalacia.   SURGEON:  Feliberto Gottron. Turner Daniels, M.D.   FIRST ASSISTANT:  Dwyane Luo, P.A.S.   ANESTHETIC:  Local with IV sedation.   ESTIMATED BLOOD LOSS:  Minimal.   FLUID REPLACEMENT:  500 mL of crystalloid.   DRAINS PLACED:  None.   TOURNIQUET TIME:  None.   INDICATIONS FOR PROCEDURE:  The patient is a 60 year old woman with some  arthritic changes of the patellofemoral joint as well as the medial, great  and lateral compartment of her left knee. She has mechanical catching,  popping and locking tender over the medial joint line consistent with medial  compartment arthritis versus medial meniscal tear. The patellofemoral  compartment also has extensive arthritis on the sunrise view and she had  some pain under her patella as well. She is failed conservative treatment  with observation, anti-inflammatory medicine and some exercises and now  desires elective arthroscopic evaluation and treatment of her left knee.  Risks and benefits of surgery discussed before today and all questions were  answered.   DESCRIPTION OF PROCEDURE:  The patient identified by armband, taken the  operating room at [facility]. Appropriate anesthetic  monitors were  attached and local anesthesia with IV sedation was induced  into the left knee. The left lower extremity was then prepped and draped in  the usual sterile fashion from the ankle to the mid thigh and we began the  procedure by making standard inferomedial and inferolateral peripatellar  portals allowing introduction of the arthroscope through the inferolateral  portal and the outflow through the inferomedial portal. Diagnostic  arthroscopy revealed grade IV chondromalacia of the lateral facette of the  patella as well as the anterior and distal aspects of the lateral femoral  condyle. The patient had grade III chondromalacia with flap tears on the  medial femoral condyle and this was debrided back to a stable margin with a  3.5 gator sucker shaver as were flap tears along the grade IV areas as well.  The ACL was intact although there were significant notch osteophytes as well  as medial and lateral femoral osteophytes indicative of the arthritic  changes.  Some small bits of cartilage were also noted in the joint fluid  and were washed out during the procedure.  The gutters were cleared medially  and laterally. The menisci were noted to be intact medially  and laterally  and photographic documentation was made of the menisci and the articular  cartilage.  The knee was irrigated out with normal saline solution. The  arthroscopic instruments removed and a dressing of Xeroform 4x4 dressing  sponges, Webril and Ace wrap applied. The patient was then taken to the  recovery room without difficulty.      Feliberto Gottron. Turner Daniels, M.D.  Electronically Signed     FJR/MEDQ  D:  08/06/2005  T:  08/06/2005  Job:  [id number]

## 2011-07-10 ENCOUNTER — Encounter (INDEPENDENT_AMBULATORY_CARE_PROVIDER_SITE_OTHER): Payer: Self-pay | Admitting: General Surgery

## 2011-07-10 ENCOUNTER — Ambulatory Visit (INDEPENDENT_AMBULATORY_CARE_PROVIDER_SITE_OTHER): Payer: BC Managed Care – PPO | Admitting: General Surgery

## 2011-07-10 VITALS — BP 122/74 | HR 78 | Temp 98.6°F | Ht 63.0 in | Wt 244.0 lb

## 2011-07-10 DIAGNOSIS — K429 Umbilical hernia without obstruction or gangrene: Secondary | ICD-10-CM

## 2011-07-10 NOTE — Progress Notes (Signed)
Chief Complaint  Patient presents with  . Other    Eval umb hernia    HPI Yolanda Chavez is a 60 y.o. female.   HPI This is a 60 year old female who went to see a Engineer, petroleum in [place] for evaluation for plastic surgery for pannus. He noted an umbilical hernia at that point and recommended her to have this repaired. She has had an umbilical bulge present for at least a number of months. This doesn't really bother her significantly. She does have some pain in her abdomen as she's doing lifting but I'm not sure this is to do her abdominal wall hernia. She does have some constipation occasionally and describes no nausea or any vomiting. She has a history of bipolar disorder which has been managed very well and has not been hospitalized since 1995. She does have a history of a normal colonoscopy also.  Past Medical History  Diagnosis Date  . Anxiety   . Bipolar 1 disorder   . Depression     Past Surgical History  Procedure Date  . 2 total knee replacements 2009 &2010  . Latareces   . Thumb surgery 1977  . Joint replacement     bilateral knee    History reviewed. No pertinent family history.  Social History History  Substance Use Topics  . Smoking status: Never Smoker   . Smokeless tobacco: Not on file  . Alcohol Use: No    No Known Allergies  Current Outpatient Prescriptions  Medication Sig Dispense Refill  . clomiPRAMINE (ANAFRANIL) 50 MG capsule Take 100 mg by mouth at bedtime.        . clonazePAM (KLONOPIN) 0.5 MG tablet Take 0.5 mg by mouth 2 (two) times daily as needed.        . diclofenac-misoprostol (ARTHROTEC 50) 50-0.2 MG per tablet Take 1 tablet by mouth 2 (two) times daily.        . divalproex (DEPAKOTE SPRINKLE) 125 MG capsule Take 125 mg by mouth 2 (two) times daily.        . QUEtiapine (SEROQUEL) 100 MG tablet Take 100 mg by mouth at bedtime.          Review of Systems Review of Systems  Gastrointestinal: Positive for abdominal pain.  All other  systems reviewed and are negative.    Blood pressure 122/74, pulse 78, temperature 98.6 F (37 C), temperature source Temporal, height 5\' 3"  (1.6 m), weight 244 lb (110.678 kg).  Physical Exam Physical Exam  Constitutional: She appears well-developed and well-nourished.  Neck: Neck supple.  Cardiovascular: Normal rate, regular rhythm and normal heart sounds.   Pulmonary/Chest: Effort normal and breath sounds normal.  Abdominal: Soft. She exhibits no distension and no mass. There is no tenderness. There is no rebound and no guarding. A hernia (reducible nontender umbilical hernia) is present.  Lymphadenopathy:    She has no cervical adenopathy.     Assessment    Umbilical hernia    Plan    We discussed the pathophysiology of umbilical hernia. It is an elective procedure to have this repaired. I discussed that it would likely need to be repaired and I recommended this due to the fact that it would get worse over time as well as the small risk of incarceration and an emergency operation. I discussed open versus laparoscopic repair. I think the best solution for her would be an open repair with mesh. I discussed the risks deemed but not limited to bleeding, infection, recurrence. She understands  and would like to have this done by the end of September. We'll make every effort to do that for her.       Nery Frappier 07/10/2011, 12:12 PM

## 2011-07-10 NOTE — Patient Instructions (Signed)
Booklet given about hernia surgery

## 2011-07-13 ENCOUNTER — Encounter (HOSPITAL_COMMUNITY)
Admission: RE | Admit: 2011-07-13 | Discharge: 2011-07-13 | Disposition: A | Discharge status: Home / Self Care | Payer: BC Managed Care – PPO | Source: Ambulatory Visit | Attending: General Surgery | Admitting: General Surgery

## 2011-07-13 ENCOUNTER — Other Ambulatory Visit (INDEPENDENT_AMBULATORY_CARE_PROVIDER_SITE_OTHER): Payer: Self-pay | Admitting: General Surgery

## 2011-07-13 DIAGNOSIS — K469 Unspecified abdominal hernia without obstruction or gangrene: Secondary | ICD-10-CM

## 2011-07-13 LAB — CBC
MCH: 31.2 pg (ref 26.0–34.0)
MCV: 92 fL (ref 78.0–100.0)
Platelets: 237 10*3/uL (ref 150–400)
RDW: 12.9 % (ref 11.5–15.5)
WBC: 8.4 10*3/uL (ref 4.0–10.5)

## 2011-07-13 LAB — DIFFERENTIAL
Basophils Relative: 0 % (ref 0–1)
Eosinophils Absolute: 0.1 10*3/uL (ref 0.0–0.7)
Eosinophils Relative: 1 % (ref 0–5)
Lymphs Abs: 3.2 10*3/uL (ref 0.7–4.0)
Monocytes Relative: 7 % (ref 3–12)
Neutrophils Relative %: 54 % (ref 43–77)

## 2011-07-13 LAB — BASIC METABOLIC PANEL
Calcium: 9.5 mg/dL (ref 8.4–10.5)
Creatinine, Ser: 0.68 mg/dL (ref 0.50–1.10)
GFR calc Af Amer: >60 mL/min (ref >60)
GFR calc non Af Amer: >60 mL/min (ref >60)
Sodium: 141 mEq/L (ref 135–145)

## 2011-07-13 LAB — SURGICAL PCR SCREEN
MRSA, PCR: NEGATIVE
Staphylococcus aureus: NEGATIVE

## 2011-07-18 ENCOUNTER — Ambulatory Visit (HOSPITAL_COMMUNITY)
Admission: RE | Admit: 2011-07-18 | Discharge: 2011-07-18 | Disposition: A | Discharge status: Home / Self Care | Payer: BC Managed Care – PPO | Source: Ambulatory Visit | Attending: General Surgery | Admitting: General Surgery

## 2011-07-18 DIAGNOSIS — K429 Umbilical hernia without obstruction or gangrene: Secondary | ICD-10-CM

## 2011-07-18 DIAGNOSIS — F319 Bipolar disorder, unspecified: Secondary | ICD-10-CM | POA: Insufficient documentation

## 2011-07-18 DIAGNOSIS — Z01818 Encounter for other preprocedural examination: Secondary | ICD-10-CM | POA: Insufficient documentation

## 2011-07-18 DIAGNOSIS — Z01810 Encounter for preprocedural cardiovascular examination: Secondary | ICD-10-CM | POA: Insufficient documentation

## 2011-07-18 DIAGNOSIS — Z01812 Encounter for preprocedural laboratory examination: Secondary | ICD-10-CM | POA: Insufficient documentation

## 2011-07-19 LAB — CBC
HCT: 39.2
HCT: 28.9 — ABNORMAL LOW
HCT: 29.6 — ABNORMAL LOW
HCT: 33.2 — ABNORMAL LOW
Hemoglobin: 13.4
Hemoglobin: 10.1 — ABNORMAL LOW
Hemoglobin: 10.4 — ABNORMAL LOW
Hemoglobin: 11.6 — ABNORMAL LOW
MCHC: 35.1
MCHC: 35.1
MCV: 94.5
MCV: 95.2
RBC: 4.15
RBC: 3.04 — ABNORMAL LOW
RBC: 3.13 — ABNORMAL LOW
RBC: 3.47 — ABNORMAL LOW
RDW: 13.2
RDW: 13.6
WBC: 6.5
WBC: 7.9

## 2011-07-19 LAB — BASIC METABOLIC PANEL
CO2: 33 — ABNORMAL HIGH
Chloride: 93 — ABNORMAL LOW
Creatinine, Ser: 0.54
GFR calc Af Amer: >60
GFR calc non Af Amer: >60
Glucose, Bld: 90
Potassium: 4.2
Potassium: 3.8
Sodium: 138

## 2011-07-19 LAB — URINE MICROSCOPIC-ADD ON

## 2011-07-19 LAB — PROTIME-INR
INR: 1.8 — ABNORMAL HIGH
Prothrombin Time: 20 — ABNORMAL HIGH
Prothrombin Time: 22.2 — ABNORMAL HIGH

## 2011-07-19 LAB — DIFFERENTIAL
Eosinophils Relative: 1
Lymphocytes Relative: 36
Lymphs Abs: 2.4
Monocytes Absolute: 0.5

## 2011-07-19 LAB — ABO/RH: ABO/RH(D): A POS

## 2011-07-19 LAB — URINALYSIS, ROUTINE W REFLEX MICROSCOPIC
Glucose, UA: NEGATIVE
Nitrite: NEGATIVE
Protein, ur: NEGATIVE
pH: 5.5

## 2011-07-19 NOTE — Op Note (Signed)
NAME:  Yolanda Chavez, Yolanda Chavez              ACCOUNT NO.:  [id number]  MEDICAL RECORD NO.:  [id number]  LOCATION:  SDSC                         FACILITY:  [facility]  PHYSICIAN:  Juanetta Gosling, MDDATE OF BIRTH:  1951-06-18  DATE OF PROCEDURE:  07/18/2011 DATE OF DISCHARGE:                              OPERATIVE REPORT   PREOPERATIVE DIAGNOSIS:  Umbilical hernia.  POSTOPERATIVE DIAGNOSIS:  Umbilical hernia.  PROCEDURE:  Umbilical hernia repair with 6.4 cm Proceed Ventral Patch.  SURGEON:  Juanetta Gosling, MD.  ASSISTANT:  None.  ANESTHESIA:  General.  SPECIMENS:  None.  DRAINS:  None.  COMPLICATIONS:  None.  DISPOSITION:  To recovery room in stable condition.  INDICATIONS:  This is a 60 year old female who is morbidly obese who has sought a Plastic Surgery evaluation for an abdominal plasty and was noted to have an umbilical hernia at that point.  She had an umbilical bulge present for a number of months that had some pain in her abdomen while she is lifting, but I am not entirely sure this is from the abdominal wall hernia.  We did discuss repair.  She was agreeable to that.  We discussed a laparoscopic or an open repair.  I told her I would elect to fix her primary umbilical hernia repair with mesh in an open fashion.  We discussed the risks associated with that procedure. After informed consent was obtained, the patient was taken to the operating room.  She was administered 1 g of intravenous cefazolin. Sequential compression devices were placed in the lower extremities prior to induction with anesthesia.  She was then placed under general endotracheal anesthesia without complication.  Her abdomen was prepped and draped in standard sterile surgical fashion.  A surgical time-out was then performed.  I then made a curvilinear incision with a transverse extension on either side of her umbilicus.  I carried this down to her umbilical stalk. This was divided.  She was  noted to have about 2-cm umbilical hernia present.  This was all easily reducible.  She had no real preperitoneal space, so I needed to place the mesh intraperitoneally.  I used a 6.4 cm Proceed Ventral patch and deployed the bottom layer.  This covered this pretty well and then I tacked the edges to the edges of the fascia without really any tension.  I actually closed the fascia for the most part together with #1 Prolene sutures as well.  This obliterated the defect that the Anesthesia did a Valsalva, and this defect was completely obliterated, and I felt that I got good coverage over the defect.  I then obtained hemostasis.  Following this, I tacked her umbilicus down with a 3-0 Vicryl in two positions.  I then closed her with 3-0 Vicryl, 4-0 Monocryl, Steri-Strips, and sterile dressing were placed overlying this.  She tolerated this well and was extubated in the operating room and transferred to recovery room in stable condition.     Juanetta Gosling, MD     MCW/MEDQ  D:  07/18/2011  T:  07/18/2011  Job:  [id number]  cc:   Molly Maduro L. Foy [place], M.D.  Electronically Signed by Emelia Loron MD on 07/19/2011 05:01:54 PM

## 2011-07-23 HISTORY — PX: HERNIA REPAIR: SHX51

## 2011-08-06 ENCOUNTER — Encounter (INDEPENDENT_AMBULATORY_CARE_PROVIDER_SITE_OTHER): Payer: Self-pay | Admitting: General Surgery

## 2011-08-06 ENCOUNTER — Ambulatory Visit (INDEPENDENT_AMBULATORY_CARE_PROVIDER_SITE_OTHER): Payer: BC Managed Care – PPO | Admitting: General Surgery

## 2011-08-06 VITALS — BP 124/88 | HR 84 | Temp 97.2°F | Resp 16 | Ht 64.0 in | Wt 247.0 lb

## 2011-08-06 DIAGNOSIS — Z09 Encounter for follow-up examination after completed treatment for conditions other than malignant neoplasm: Secondary | ICD-10-CM

## 2011-08-06 NOTE — Progress Notes (Signed)
Subjective:     Patient ID: Yolanda Chavez, female   DOB: [birth year], 60 y.o.   MRN: [id number]  HPI This is a 60 year old female who underwent an umbilical hernia repair with proceed ventral patch about 3 weeks ago. She has been doing heavy lifting and she's taking care of her mother but is otherwise doing very well. She has no real significant complaints associated with this.  Review of Systems     Objective:   Physical Exam Well healing incision without infection or significant seroma    Assessment:     S/p UH repair    Plan:     I released her to full activity and will see her as needed

## 2011-08-20 ENCOUNTER — Telehealth (INDEPENDENT_AMBULATORY_CARE_PROVIDER_SITE_OTHER): Payer: Self-pay

## 2011-08-20 NOTE — Telephone Encounter (Signed)
Called patient to check on her per blue line. She said she is certain she has another hernia. I talked to [facility] and was able to make her an apppointment to see  Dwain Sarna next Monday. Patient said she would call if pain got bad or hernia wasn't reduceable.

## 2011-08-21 ENCOUNTER — Encounter (INDEPENDENT_AMBULATORY_CARE_PROVIDER_SITE_OTHER): Payer: Self-pay | Admitting: General Surgery

## 2011-08-21 ENCOUNTER — Encounter (INDEPENDENT_AMBULATORY_CARE_PROVIDER_SITE_OTHER): Payer: BC Managed Care – PPO | Admitting: Surgery

## 2011-08-21 ENCOUNTER — Ambulatory Visit (INDEPENDENT_AMBULATORY_CARE_PROVIDER_SITE_OTHER): Payer: BC Managed Care – PPO | Admitting: General Surgery

## 2011-08-21 VITALS — BP 134/94 | HR 84 | Temp 99.0°F | Resp 16 | Ht 63.5 in | Wt 239.8 lb

## 2011-08-21 DIAGNOSIS — T8140XA Infection following a procedure, unspecified, initial encounter: Secondary | ICD-10-CM

## 2011-08-21 DIAGNOSIS — T8149XA Infection following a procedure, other surgical site, initial encounter: Secondary | ICD-10-CM

## 2011-08-21 LAB — BASIC METABOLIC PANEL
CO2: 28 mEq/L (ref 19–32)
Calcium: 9.3 mg/dL (ref 8.4–10.5)
Creat: 0.72 mg/dL (ref 0.50–1.10)

## 2011-08-21 MED ORDER — CEPHALEXIN 500 MG PO CAPS: 500.0000 mg | ORAL_CAPSULE | Freq: Two times a day (BID) | ORAL | Status: DC

## 2011-08-21 NOTE — Progress Notes (Signed)
Subjective:     Patient ID: Yolanda Chavez, female   DOB: 1951/01/02, 60 y.o.   MRN: [id number]  HPI This is a 60 year old female who I saw 2 weeks ago and doing well after umbilical hernia repair with mesh. She had no complaints at that time. Since then she has developed drainage from her wound as well as some difficulty with some bloating. She is having bowel movements. She is passing some flatus. She describes some brownish foul-smelling drainage from her umbilicus.  Review of Systems     Objective:   Physical Exam  Constitutional: She appears well-developed and well-nourished.  Abdominal: Soft. There is no tenderness.         Assessment:     Likely SSI of umbilical hernia repair    Plan:        She had really done well immediately and I think she may develop a small seromatous infected now. She is not systemically ill at this point. I am however concern due to the presence of mesh we discussed all of her different options. We discussed oral antibiotics versus admission for IV antibiotics. I think we'll we'll plan on doing this I will put her on Keflex now and obtain a CT scan tomorrow as well as some laboratory evaluation. I will follow up with her tomorrow if this is not quickly improve she can do need to come in the hospital we also discussed possible mesh infection and what that might entail as well she is understanding of this and we will followup quickly.

## 2011-08-22 ENCOUNTER — Other Ambulatory Visit (INDEPENDENT_AMBULATORY_CARE_PROVIDER_SITE_OTHER): Payer: Self-pay | Admitting: General Surgery

## 2011-08-22 ENCOUNTER — Ambulatory Visit
Admission: RE | Admit: 2011-08-22 | Discharge: 2011-08-22 | Disposition: A | Discharge status: Home / Self Care | Payer: BC Managed Care – PPO | Source: Ambulatory Visit | Attending: General Surgery | Admitting: General Surgery

## 2011-08-22 DIAGNOSIS — T8149XA Infection following a procedure, other surgical site, initial encounter: Secondary | ICD-10-CM

## 2011-08-22 LAB — CBC WITH DIFFERENTIAL/PLATELET
Basophils Relative: 1 % (ref 0–1)
HCT: 40.7 % (ref 36.0–46.0)
Hemoglobin: 13.6 g/dL (ref 12.0–15.0)
Lymphocytes Relative: 28 % (ref 12–46)
MCHC: 33.4 g/dL (ref 30.0–36.0)
Monocytes Absolute: 0.5 10*3/uL (ref 0.1–1.0)
Monocytes Relative: 6 % (ref 3–12)
Neutro Abs: 5.2 10*3/uL (ref 1.7–7.7)
Neutrophils Relative %: 63 % (ref 43–77)
WBC: 8.2 10*3/uL (ref 4.0–10.5)

## 2011-08-22 MED ORDER — IOHEXOL 300 MG/ML  SOLN
125.0000 mL | Freq: Once | INTRAMUSCULAR | Status: AC | PRN
  Administered 2011-08-22: 125 mL via INTRAVENOUS

## 2011-08-23 ENCOUNTER — Observation Stay (HOSPITAL_COMMUNITY)
Admission: RE | Admit: 2011-08-23 | Discharge: 2011-08-25 | DRG: 415 | Disposition: A | Discharge status: Home Health | Payer: BC Managed Care – PPO | Source: Ambulatory Visit | Attending: General Surgery | Admitting: General Surgery

## 2011-08-23 DIAGNOSIS — IMO0002 Reserved for concepts with insufficient information to code with codable children: Principal | ICD-10-CM | POA: Insufficient documentation

## 2011-08-23 DIAGNOSIS — F411 Generalized anxiety disorder: Secondary | ICD-10-CM | POA: Insufficient documentation

## 2011-08-23 DIAGNOSIS — Y9210 Unspecified residential institution as the place of occurrence of the external cause: Secondary | ICD-10-CM | POA: Insufficient documentation

## 2011-08-23 DIAGNOSIS — F319 Bipolar disorder, unspecified: Secondary | ICD-10-CM | POA: Insufficient documentation

## 2011-08-23 DIAGNOSIS — Y849 Medical procedure, unspecified as the cause of abnormal reaction of the patient, or of later complication, without mention of misadventure at the time of the procedure: Secondary | ICD-10-CM | POA: Insufficient documentation

## 2011-08-23 DIAGNOSIS — K429 Umbilical hernia without obstruction or gangrene: Secondary | ICD-10-CM | POA: Insufficient documentation

## 2011-08-23 DIAGNOSIS — T8579XA Infection and inflammatory reaction due to other internal prosthetic devices, implants and grafts, initial encounter: Secondary | ICD-10-CM

## 2011-08-23 LAB — CBC
Hemoglobin: 12.4 g/dL (ref 12.0–15.0)
MCHC: 34 g/dL (ref 30.0–36.0)
Platelets: 202 10*3/uL (ref 150–400)
RDW: 12.8 % (ref 11.5–15.5)

## 2011-08-23 LAB — SURGICAL PCR SCREEN
MRSA, PCR: NEGATIVE
Staphylococcus aureus: NEGATIVE

## 2011-08-27 ENCOUNTER — Ambulatory Visit (INDEPENDENT_AMBULATORY_CARE_PROVIDER_SITE_OTHER): Payer: BC Managed Care – PPO | Admitting: General Surgery

## 2011-08-27 ENCOUNTER — Encounter (INDEPENDENT_AMBULATORY_CARE_PROVIDER_SITE_OTHER): Payer: Self-pay | Admitting: General Surgery

## 2011-08-27 VITALS — BP 132/92 | HR 64 | Temp 97.7°F | Resp 18 | Ht 63.5 in | Wt 243.0 lb

## 2011-08-27 DIAGNOSIS — Z09 Encounter for follow-up examination after completed treatment for conditions other than malignant neoplasm: Secondary | ICD-10-CM

## 2011-08-27 MED ORDER — OXYCODONE HCL 5 MG PO TABS: 5.0000 mg | ORAL_TABLET | Freq: Four times a day (QID) | ORAL | Status: AC | PRN

## 2011-08-27 NOTE — Op Note (Signed)
  NAMELYLY, CANIZALES              ACCOUNT NO.:  [id number]  MEDICAL RECORD NO.:  [id number]  LOCATION:  5127                         FACILITY:  [facility]  PHYSICIAN:  Juanetta Gosling, MDDATE OF BIRTH:  Aug 07, 1951  DATE OF PROCEDURE:  08/23/2011 DATE OF DISCHARGE:                              OPERATIVE REPORT   PREOPERATIVE DIAGNOSIS:  Status post umbilical hernia with infected seroma.  POSTOPERATIVE DIAGNOSIS:  Status post umbilical hernia with infected seroma  PROCEDURES: 1. Drainage of infected seroma. 2. Removal of Proceed ventral patch. 3. Primary repair umbilical hernia.  SURGEON:  Juanetta Gosling, MD  ASSISTANT:  None.  ANESTHESIA:  General.  SUPERVISING ANESTHESIOLOGIST:  Germaine Pomfret, MD  SPECIMENS:  Cultures to Microbiology.  ESTIMATED BLOOD LOSS:  Minimal.  COMPLICATIONS:  None.  DRAINS:  None.  DISPOSITION:  The patient to recovery in stable condition.  INDICATION:  This is a 60 year old female who had an umbilical repair with mesh about 7 weeks ago, now who presented to my office on Monday with cellulitis.  I did a CT scan on her.  This confirmed that there was a seroma.  Since then, she has also started draining and I told her we need to go to the operating room and drain this and possibly remove her mesh.  We discussed the risks including recurrence of her hernia that would need repair at a later date.  DESCRIPTION OF PROCEDURE:  After informed consent was obtained, the patient was taken to the operating room.  She was administered 2 g of intravenous cefazolin.  Sequential compression devices were placed on the lower extremities.  She was then placed under general anesthesia without complication.  Her abdomen was prepped and draped in standard sterile surgical fashion.  It had started to drain and this cellulitis had increased significantly since I had last seen her on Monday.  I then probed the old incision.  This all opened really  on its own.  There was a little large amount of infected seroma fluid that was present and had a foul smell.  I took cultures of this and these are pending now.  I evacuated this entire area.  I decided to remove the mesh and I removed it very easily, consistent with her having an infection and it was removed in its entirety.  I then irrigated this copiously until it was as clean as possible.  I did re-close her defect with 2-0 Vicryl sutures as I do not want to place permanent suture in this wound at this time. She understands that she will likely have a hernia in the future and get the wound repaired with a laparoscope.  I would not enter this wound again to repair her hernia.  I then placed Betadine-soaked gauze on this.  She was extubated and transferred to recovery room in stable condition.     Juanetta Gosling, MD     MCW/MEDQ  D:  08/23/2011  T:  08/23/2011  Job:  [id number]  Electronically Signed by Emelia Loron MD on 08/27/2011 01:25:43 PM

## 2011-08-27 NOTE — Progress Notes (Signed)
Subjective:     Patient ID: Yolanda Chavez, female   DOB: [birth year], 60 y.o.   MRN: [id number]  HPI This is a 60 year old female I had done a umbilical hernia repair with mesh. About 6-7 weeks postop she had developed an infected seroma. I decided to take her back to the operating room. I released this infected seroma and then I removed her mesh due to my concern for infection. Culture is not growing anything out yet to this point. She was discharged home and has been doing well. She's doing dressing changes twice daily. She has no fevers. Her appetite has returned to normal.  Review of Systems     Objective:   Physical Exam    open umbilical wound with granulation tissue no erythema or other evidence infection Assessment:     S/p excision of umbilical hernia mesh, drainage seroma    Plan:     She's going to do once daily dressing changes. I told her that there is a good chance or he would return and we will be to repair this laparoscopically. For now we'll just going to get this to heal and then we'll deal with that later. I will see her back in one month.

## 2011-08-28 LAB — ANAEROBIC CULTURE

## 2011-09-06 NOTE — Discharge Summary (Signed)
Yolanda Chavez, Yolanda Chavez              ACCOUNT NO.:  [id number]  MEDICAL RECORD NO.:  [id number]  LOCATION:  5127                         FACILITY:  [facility]  PHYSICIAN:  Juanetta Gosling, MDDATE OF BIRTH:  [birth year]  DATE OF ADMISSION:  08/23/2011 DATE OF DISCHARGE:  08/25/2011                              DISCHARGE SUMMARY   ADMISSION DIAGNOSES: 1. Morbid obesity. 2. Anxiety. 3. Bipolar disorder. 4. Status post umbilical hernia repair recently with wound infection.  DISCHARGE DIAGNOSES: 1. Morbid obesity. 2. Anxiety. 3. Bipolar disorder. 4. Status post umbilical hernia repair recently with wound infection.  PROCEDURES PERFORMED:  On August 23, 2011, drainage of infected seroma and removal of Proceed ventral patch with primary repair of umbilical hernia.  HISTORY AND HOSPITAL COURSE:  This is a 60 year old female who I did an umbilical repair with mesh and then I had seen several weeks after operation, she was doing well.  Since that last visit, where I had released her, she had began having drainage from her wound as well as some difficulty with some bloating.  This looked like a cellulitis with a seroma underneath.  I sent her to get a CAT scan.  She had a very large seroma present.  I talked to her over by phone, and she said this area started to drain, so I have asked her to come in for admission, and we discussed going to the operating room and draining this area and putting on IV antibiotics.  We discussed possibly losing her mesh and to go to the operating room.  She clearly has a lot of murky fluid and I was very concerned about her mesh given her other medical problems and her body habitus, so I did remove her mesh at the same time.  I left the wound open.  She was maintained on antibiotics and then eventually was able to take care of her dressing, tolerating regular diet and was discharged home.  PERTINENT LABORATORY EVALUATION:  White count was 9.6, hematocrit  was 36.5.  DISCHARGE MEDICATIONS:  Quetiapine, Percocet, divalproex, Arthrotec, Colace, clonazepam, clomipramine, as well as Augmentin 875 mg b.i.d.  FOLLOWUP:  With Dr. Dwain Sarna, [facility] in 1 week.     Juanetta Gosling, MD     MCW/MEDQ  D:  09/06/2011  T:  09/06/2011  Job:  [id number]

## 2011-09-25 ENCOUNTER — Ambulatory Visit (INDEPENDENT_AMBULATORY_CARE_PROVIDER_SITE_OTHER): Payer: BC Managed Care – PPO | Admitting: General Surgery

## 2011-09-25 ENCOUNTER — Encounter (INDEPENDENT_AMBULATORY_CARE_PROVIDER_SITE_OTHER): Payer: BC Managed Care – PPO | Admitting: General Surgery

## 2011-09-25 ENCOUNTER — Encounter (INDEPENDENT_AMBULATORY_CARE_PROVIDER_SITE_OTHER): Payer: Self-pay | Admitting: General Surgery

## 2011-09-25 VITALS — BP 132/90 | HR 92 | Temp 97.6°F | Resp 18 | Ht 64.0 in | Wt 239.4 lb

## 2011-09-25 DIAGNOSIS — Z09 Encounter for follow-up examination after completed treatment for conditions other than malignant neoplasm: Secondary | ICD-10-CM

## 2011-09-25 NOTE — Progress Notes (Signed)
Subjective:     Patient ID: Yolanda Chavez, female   DOB: 06/06/1951, 60 y.o.   MRN: [id number]  HPI This is a 60 year old female I did an umbilical hernia repair with mesh on. This was complicated by an infection for which I took her back to the operating room. I thought it best at that time she had a very large seroma with an infection to remove the mesh and primarily repair hernia. She been taking care of her wound at home and this is now healed by secondary intention. She's doing well without any significant complaints. She has an occasional pains at the site of her incision but there is not a bulge that is noticeable there right now at all. She comes in today just for a check. She also asked today about possible weight loss surgery.  Review of Systems     Objective:   Physical Exam Healed umbilical wound without infection or obvious hernia now    Assessment:     S/p UH repair c/b infection and mesh removal     Plan:     She is doing really well. We discussed again that her risk of a recurrent hernia is fairly high. I will plan on seeing her back in 2 months just to relook at this area. She is also like to discuss weight loss surgery and I will refer her to one of our seminars.

## 2011-11-30 ENCOUNTER — Encounter (INDEPENDENT_AMBULATORY_CARE_PROVIDER_SITE_OTHER): Payer: Self-pay | Admitting: General Surgery

## 2011-11-30 ENCOUNTER — Ambulatory Visit (INDEPENDENT_AMBULATORY_CARE_PROVIDER_SITE_OTHER): Payer: BC Managed Care – PPO | Admitting: General Surgery

## 2011-11-30 VITALS — BP 128/82 | HR 76 | Temp 97.9°F | Resp 18 | Ht 63.5 in | Wt 245.4 lb

## 2011-11-30 DIAGNOSIS — Z09 Encounter for follow-up examination after completed treatment for conditions other than malignant neoplasm: Secondary | ICD-10-CM

## 2011-11-30 NOTE — Progress Notes (Signed)
Subjective:     Patient ID: Yolanda Chavez, female   DOB: 07-03-1951, 61 y.o.   MRN: [id number]  HPI This is a 61 year old female I did an umbilical hernia repair with mesh on. This was complicated by an infection for which I took her back to the operating room. I thought it best at that time she had a very large seroma with an infection to remove the mesh and primarily repair her hernia. Her wound is now healed.  She reports no complaints referable to her umbilicus.  Review of Systems     Objective:   Physical Exam Well healed umbilical incision without infection, healed, no hernia noted    Assessment:     S/p uh repair with mesh c/b infection requiring mesh removal and primary repair    Plan:     Return to full activity She is certainly high risk for recurrence and if she notes any symptoms she will call back.

## 2011-11-30 NOTE — Patient Instructions (Signed)
May return to full activity.  Call me back if you notice a lump, bulge or pain at your belly button

## 2012-10-29 IMAGING — CR DG CHEST 2V
2 series · 2 of 2 positions shown · non-contrast
Comparison: 11/24/2008

CLINICAL DATA: Preoperative for hernia repair.

CHEST - 2 VIEW

[view not recorded (1 of 2)]
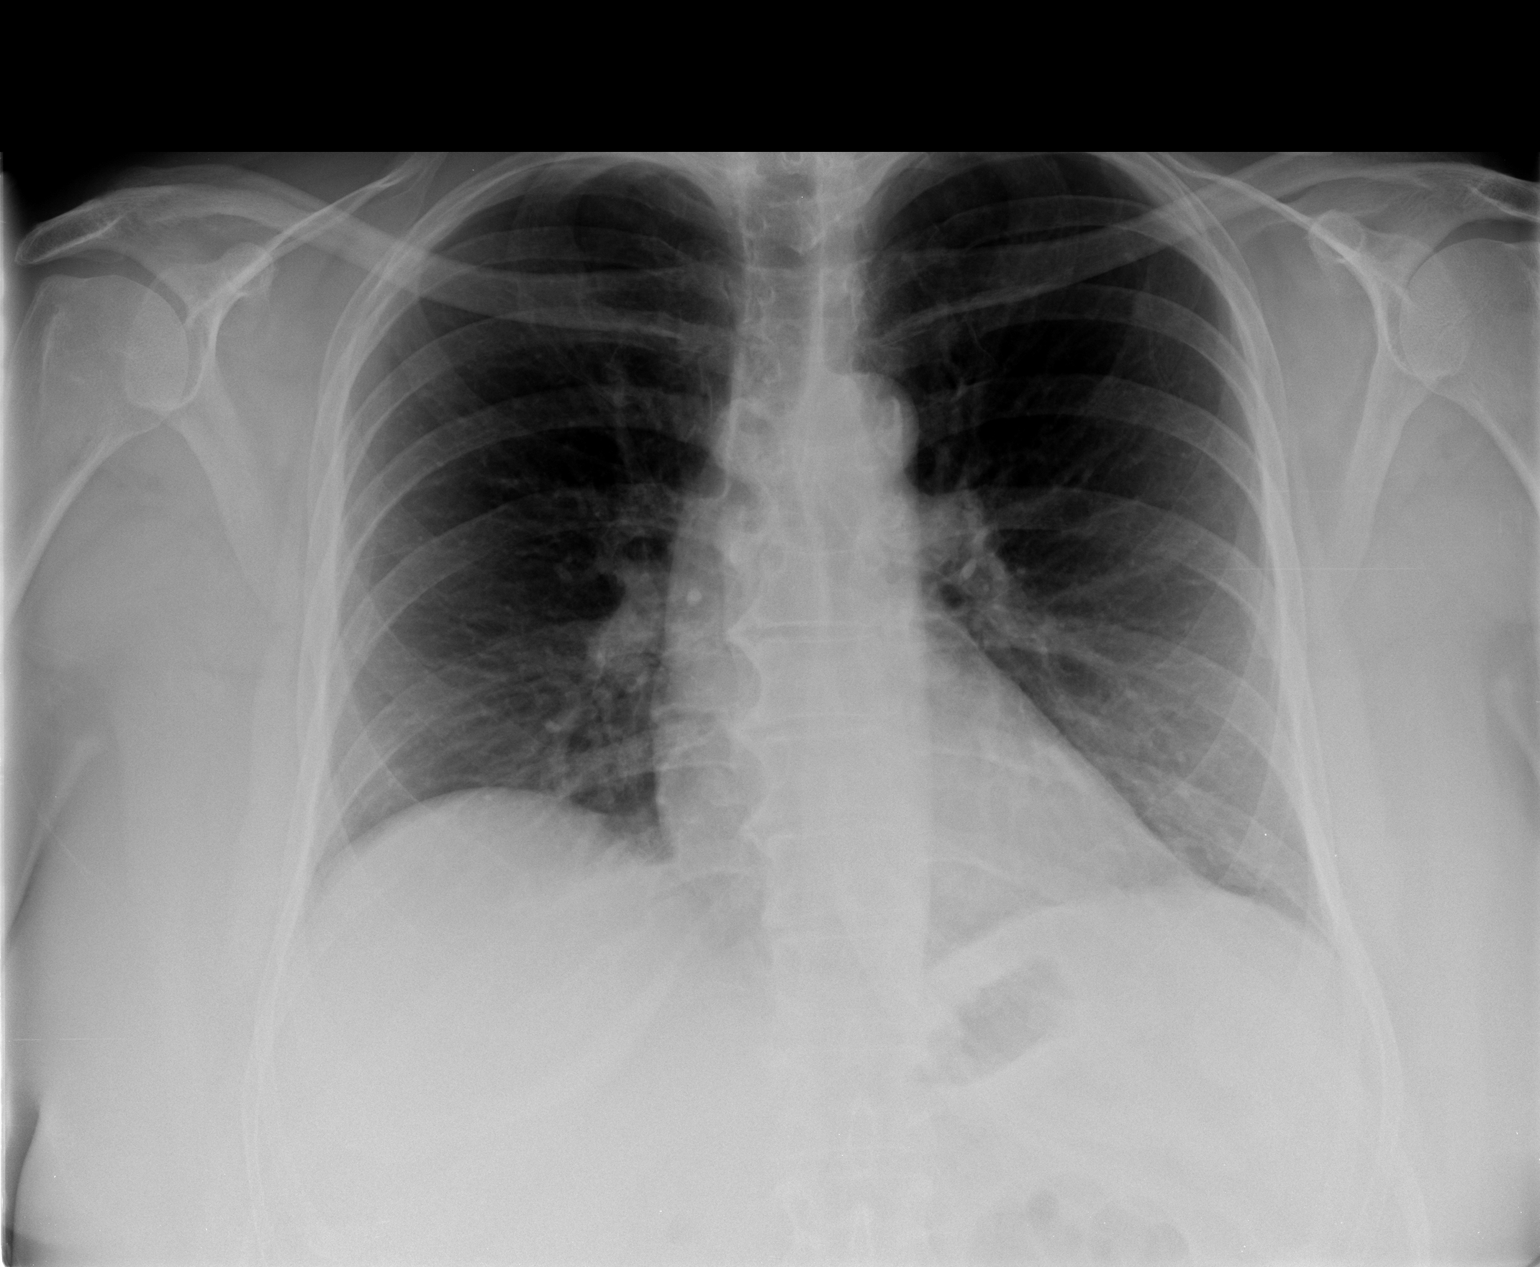

[view not recorded (2 of 2)]
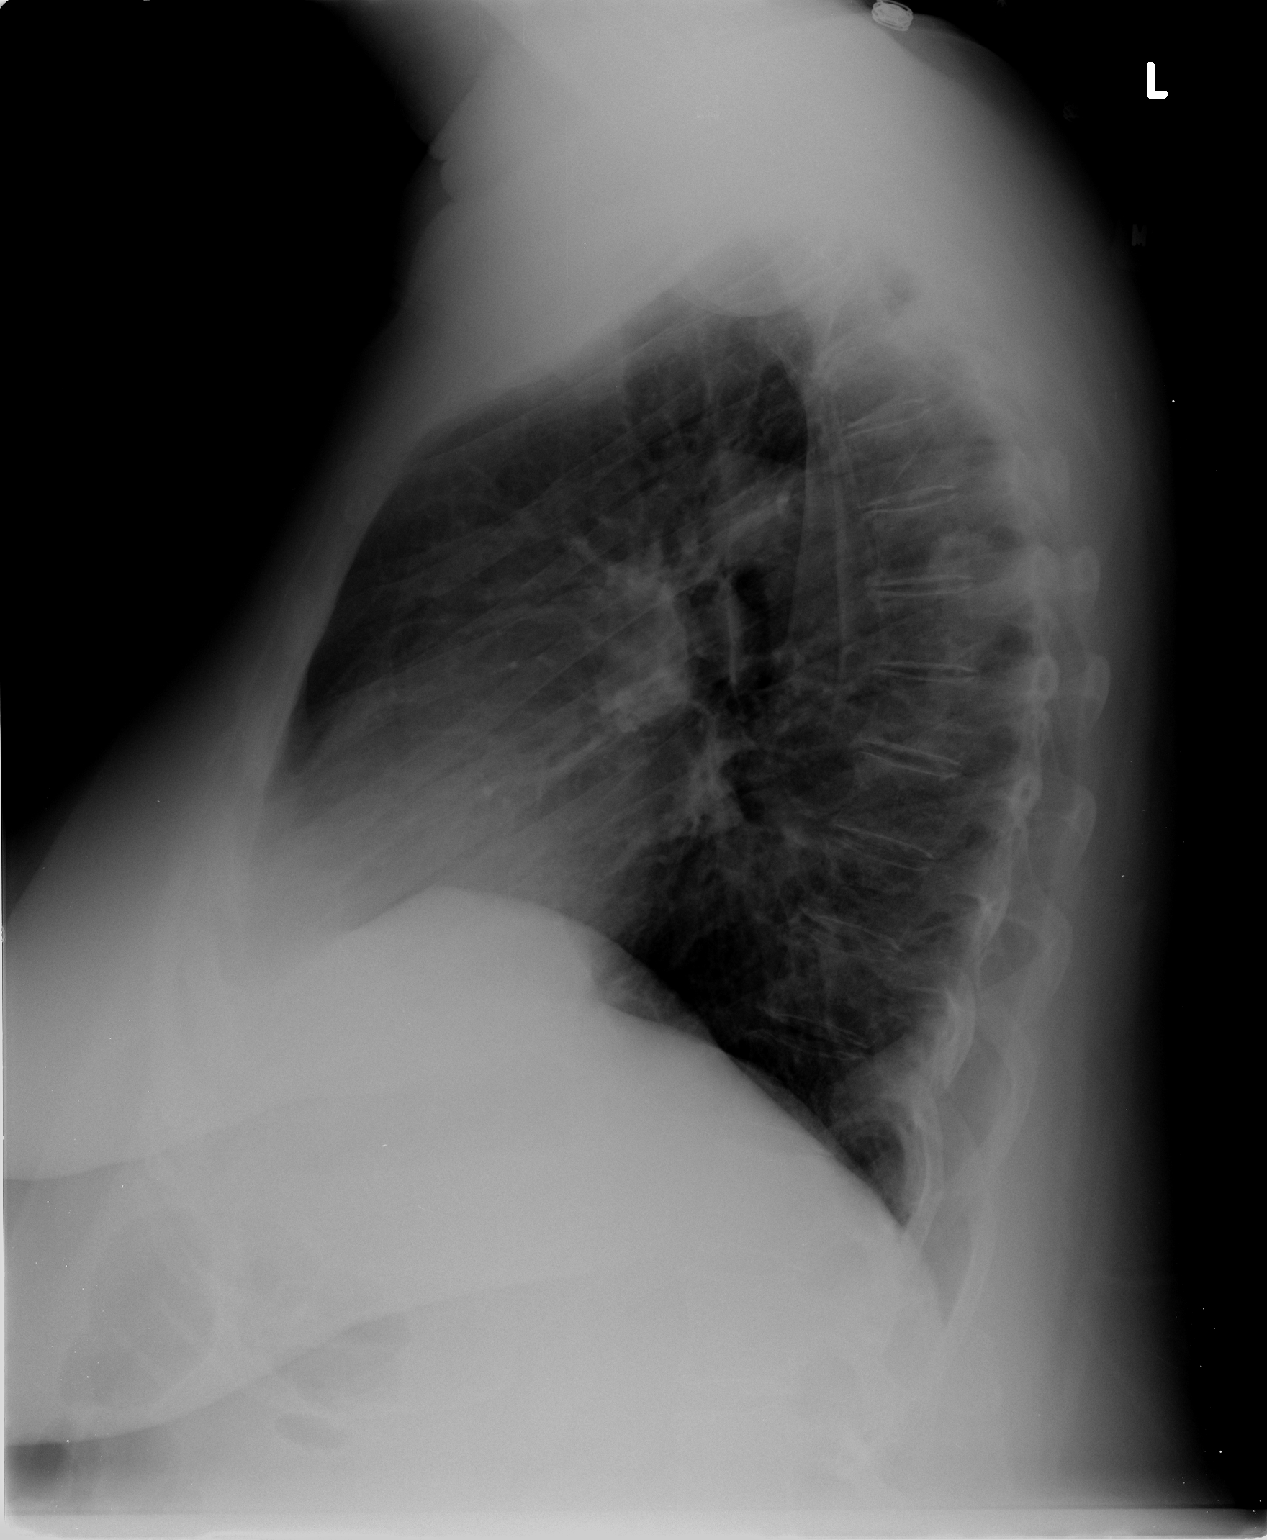

[2 of 2 positions shown; findings below may reference images not displayed]

FINDINGS: Cardiac and mediastinal contours appear normal.

The lungs appear clear.

No pleural effusion is identified.  Thoracic spondylosis noted
along with mild atherosclerotic vascular disease.

Densities over the posterior thoracic spine on the lateral
projection are attributable to osteophytes and were present on
04/20/2008.
IMPRESSION: 1.  Thoracic spondylosis.
2.  Aortic atherosclerosis.

## 2012-11-05 ENCOUNTER — Other Ambulatory Visit: Payer: Self-pay | Admitting: Family Medicine

## 2012-11-05 DIAGNOSIS — Z1231 Encounter for screening mammogram for malignant neoplasm of breast: Secondary | ICD-10-CM

## 2012-12-02 ENCOUNTER — Ambulatory Visit
Admission: RE | Admit: 2012-12-02 | Discharge: 2012-12-02 | Disposition: A | Discharge status: Home / Self Care | Payer: Medicare Other | Source: Ambulatory Visit | Attending: Family Medicine | Admitting: Family Medicine

## 2012-12-02 DIAGNOSIS — Z1231 Encounter for screening mammogram for malignant neoplasm of breast: Secondary | ICD-10-CM

## 2012-12-08 ENCOUNTER — Other Ambulatory Visit: Payer: Self-pay | Admitting: Family Medicine

## 2012-12-08 DIAGNOSIS — R928 Other abnormal and inconclusive findings on diagnostic imaging of breast: Secondary | ICD-10-CM

## 2012-12-08 IMAGING — CT CT ABD-PELV W/ CM
2 of 5 series · 17 of 46 positions shown, 19 images · IV contrast (READICAT/WATER & [ID] OMNI 300)
Comparison: None.

CLINICAL DATA: Umbilical hernia repair 07/20/2011, open wound with
drainage, evaluate for abscess

CT ABDOMEN AND PELVIS WITH CONTRAST
TECHNIQUE: Multidetector CT imaging of the abdomen and pelvis was
performed following the standard protocol during bolus
administration of intravenous contrast.
Contrast: 125mL OMNIPAQUE IOHEXOL 300 MG/ML IV SOLN

[Series 2: abd/pelvis with · axial · 0.98mm/px · z∈[-400,+25]mm · 14 of 95 slices shown, 16 images]
[im 5/95  soft-tissue]
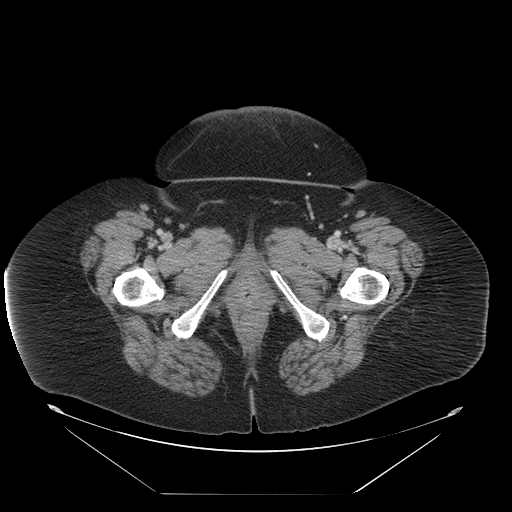
[im 5/95  bone]
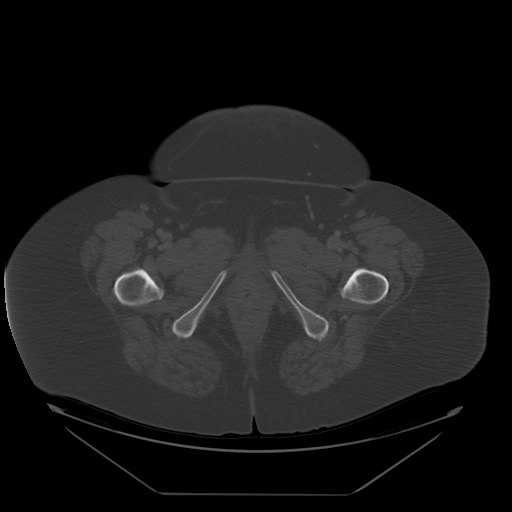
[im 15/95  soft-tissue]
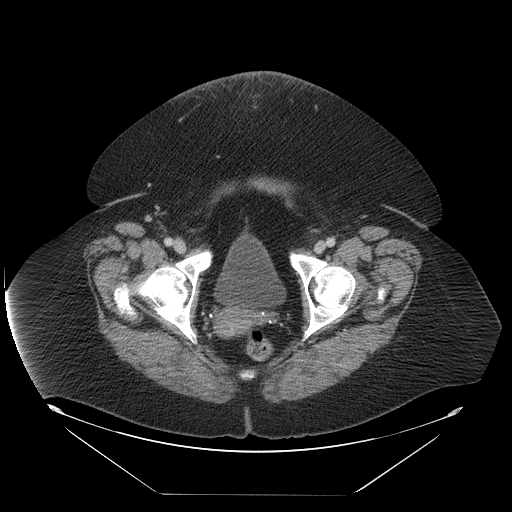
[im 19/95  soft-tissue]
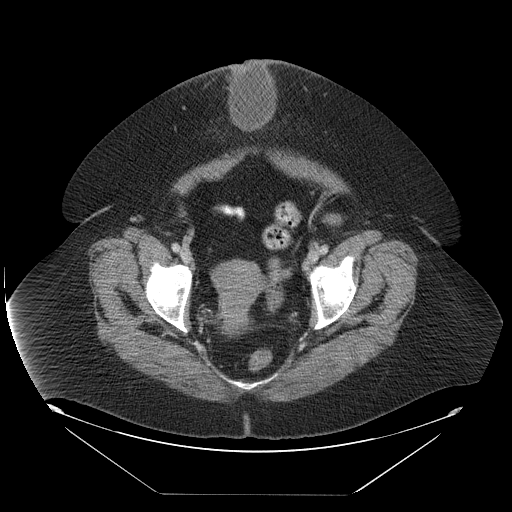
[im 24/95  soft-tissue]
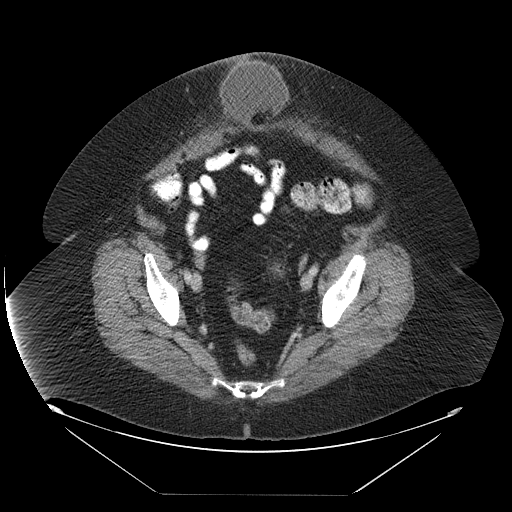
[im 33/95  soft-tissue]
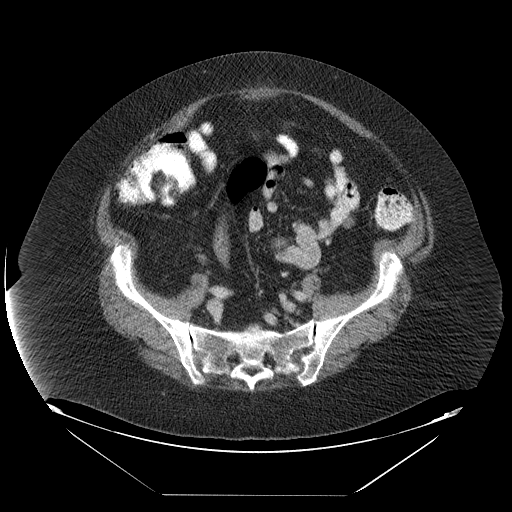
[im 38/95  soft-tissue]
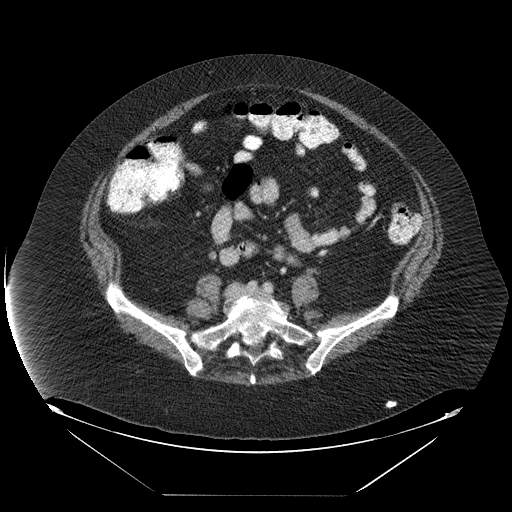
[im 43/95  soft-tissue]
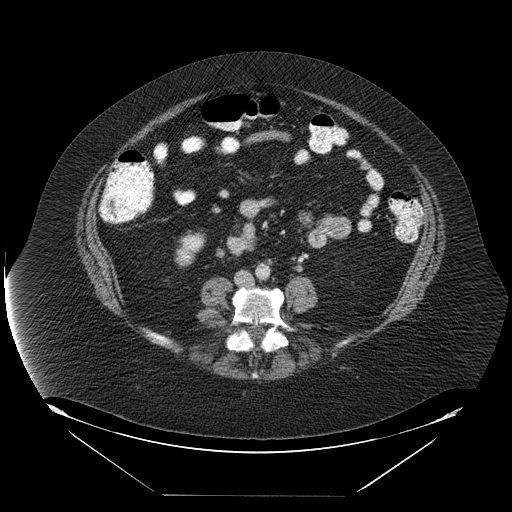
[im 52/95  soft-tissue]
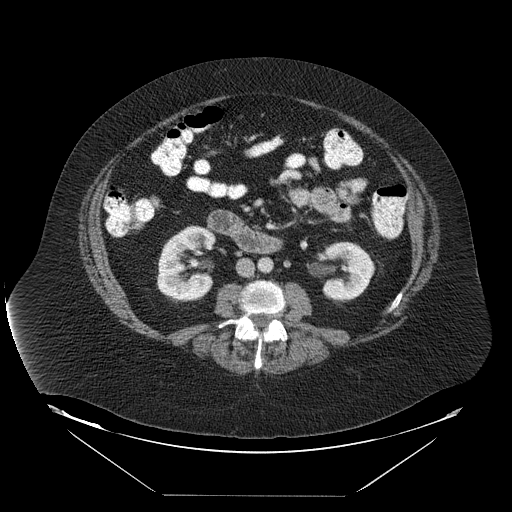
[im 57/95  soft-tissue]
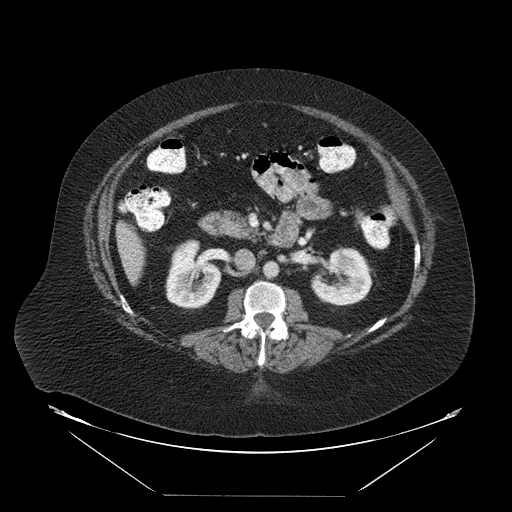
[im 57/95  bone]
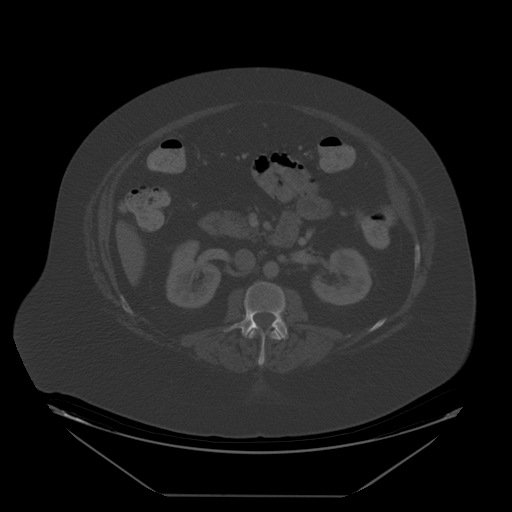
[im 62/95  soft-tissue]
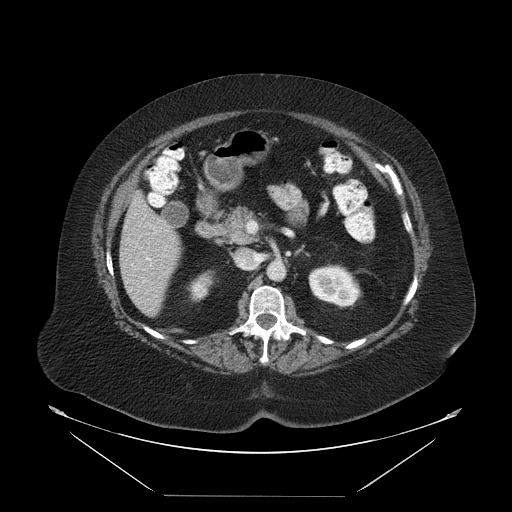
[im 71/95  soft-tissue]
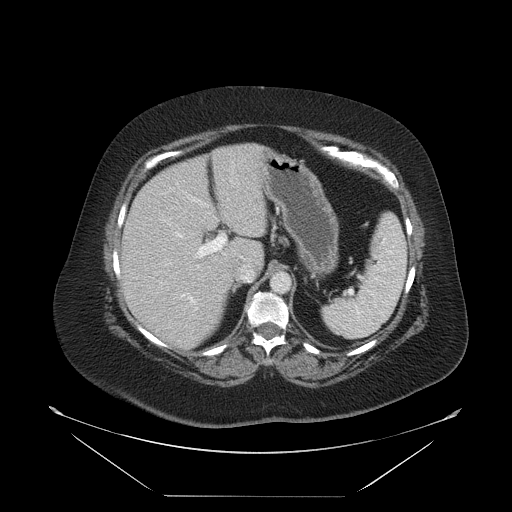
[im 76/95  soft-tissue]
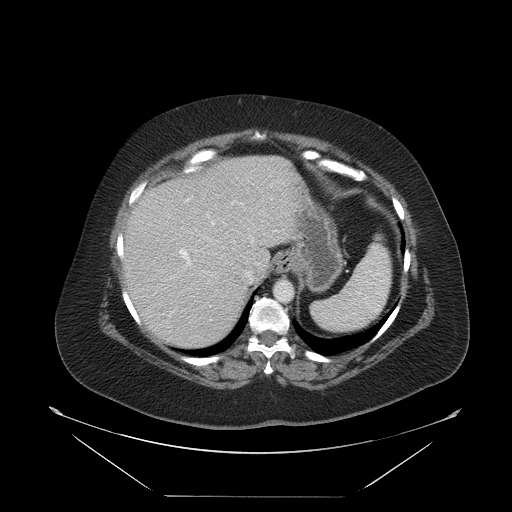
[im 80/95  soft-tissue]
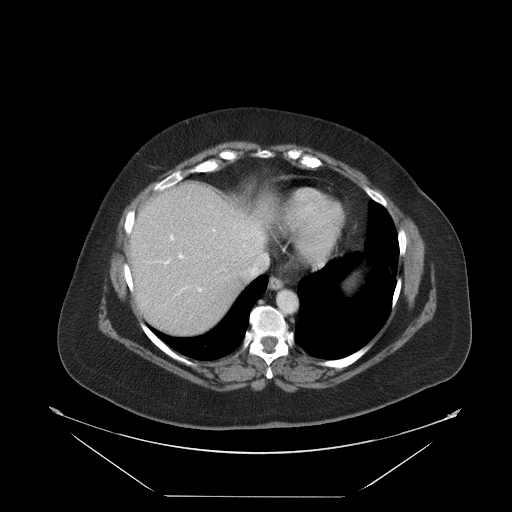
[im 90/95  soft-tissue]
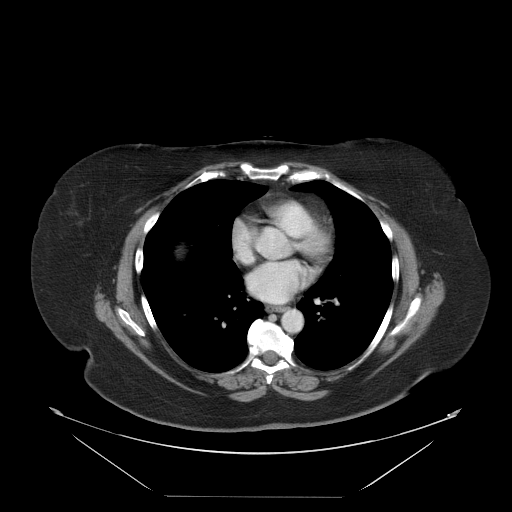

[Series 400: cor · coronal · 0.98mm/px · 3 of 160 slices shown]
[im 54/160  soft-tissue]
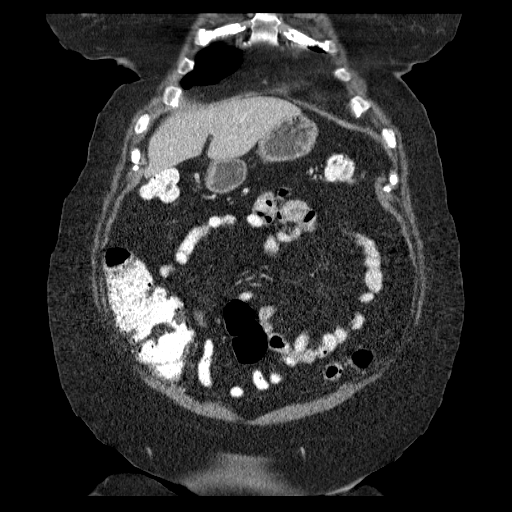
[im 71/160  soft-tissue]
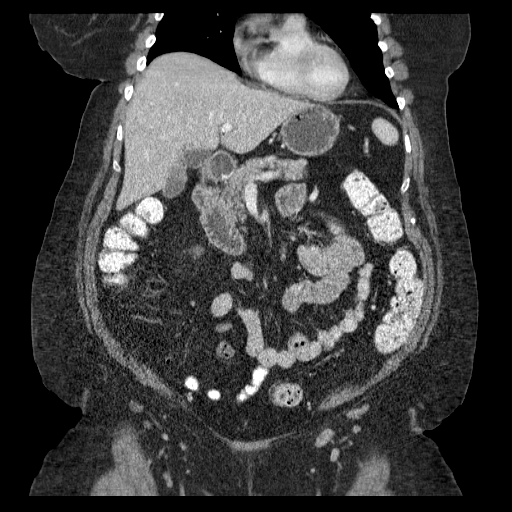
[im 89/160  soft-tissue]
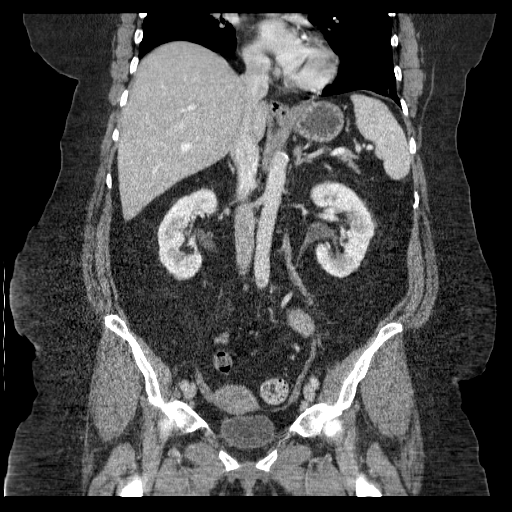

[17 of 46 positions shown; findings below may reference images not displayed]

FINDINGS: Lung bases are clear.

Liver, spleen, pancreas, and adrenal glands within normal limits.

Gallbladder is unremarkable.  No intrahepatic or extrahepatic
ductal dilatation.

Kidneys within normal limits.  No hydronephrosis.

No evidence of bowel obstruction.  Normal appendix.

No evidence of abdominal aortic aneurysm.

No abdominopelvic ascites.

No suspicious abdominopelvic lymphadenopathy.

Uterus and bilateral ovaries are unremarkable.

Bladder is within normal limits.

6.8 x 6.0 x 6.8 cm fluid collection in the midline anterior
abdominal wall.  Overlying skin thickening and possible tiny foci
of nondependent gas (series 2/image 76), raising concern for
superimposed infection/abscess.

Tiny fat containing right inguinal hernia.  Degenerative changes of
the visualized thoracolumbar spine.
IMPRESSION: 6.8 cm fluid collection in the midline anterior abdominal wall, as
described above, worrisome for superimposed infection/abscess.

## 2012-12-12 ENCOUNTER — Ambulatory Visit
Admission: RE | Admit: 2012-12-12 | Discharge: 2012-12-12 | Disposition: A | Discharge status: Home / Self Care | Payer: Medicare Other | Source: Ambulatory Visit | Attending: Family Medicine | Admitting: Family Medicine

## 2012-12-12 DIAGNOSIS — R928 Other abnormal and inconclusive findings on diagnostic imaging of breast: Secondary | ICD-10-CM

## 2013-05-18 ENCOUNTER — Other Ambulatory Visit: Payer: Self-pay | Admitting: Family Medicine

## 2013-05-18 DIAGNOSIS — N6310 Unspecified lump in the right breast, unspecified quadrant: Secondary | ICD-10-CM

## 2013-06-02 ENCOUNTER — Ambulatory Visit
Admission: RE | Admit: 2013-06-02 | Discharge: 2013-06-02 | Disposition: A | Discharge status: Home / Self Care | Payer: Medicare Other | Source: Ambulatory Visit | Attending: Family Medicine | Admitting: Family Medicine

## 2013-06-02 DIAGNOSIS — N6310 Unspecified lump in the right breast, unspecified quadrant: Secondary | ICD-10-CM

## 2014-04-29 ENCOUNTER — Emergency Department (HOSPITAL_COMMUNITY)
Admission: EM | Admit: 2014-04-29 | Discharge: 2014-04-29 | Disposition: A | Discharge status: Home / Self Care | Payer: Medicare Other | Attending: Emergency Medicine | Admitting: Emergency Medicine

## 2014-04-29 ENCOUNTER — Encounter (HOSPITAL_COMMUNITY): Payer: Self-pay | Admitting: Emergency Medicine

## 2014-04-29 ENCOUNTER — Emergency Department (HOSPITAL_COMMUNITY): Payer: Medicare Other

## 2014-04-29 DIAGNOSIS — Z8719 Personal history of other diseases of the digestive system: Secondary | ICD-10-CM | POA: Insufficient documentation

## 2014-04-29 DIAGNOSIS — F411 Generalized anxiety disorder: Secondary | ICD-10-CM | POA: Insufficient documentation

## 2014-04-29 DIAGNOSIS — F319 Bipolar disorder, unspecified: Secondary | ICD-10-CM | POA: Insufficient documentation

## 2014-04-29 DIAGNOSIS — R1011 Right upper quadrant pain: Secondary | ICD-10-CM | POA: Insufficient documentation

## 2014-04-29 DIAGNOSIS — R4182 Altered mental status, unspecified: Secondary | ICD-10-CM | POA: Insufficient documentation

## 2014-04-29 DIAGNOSIS — Z79899 Other long term (current) drug therapy: Secondary | ICD-10-CM | POA: Insufficient documentation

## 2014-04-29 LAB — COMPREHENSIVE METABOLIC PANEL
ALT: 27 U/L (ref 0–35)
AST: 19 U/L (ref 0–37)
Albumin: 2.9 g/dL — ABNORMAL LOW (ref 3.5–5.2)
Alkaline Phosphatase: 83 U/L (ref 39–117)
Anion gap: 13 (ref 5–15)
BILIRUBIN TOTAL: 0.3 mg/dL (ref 0.3–1.2)
BUN: 13 mg/dL (ref 6–23)
CALCIUM: 8.6 mg/dL (ref 8.4–10.5)
CHLORIDE: 97 meq/L (ref 96–112)
CO2: 30 meq/L (ref 19–32)
CREATININE: 0.57 mg/dL (ref 0.50–1.10)
GLUCOSE: 129 mg/dL — AB (ref 70–99)
Potassium: 5.2 mEq/L (ref 3.7–5.3)
Sodium: 140 mEq/L (ref 137–147)
Total Protein: 6.9 g/dL (ref 6.0–8.3)

## 2014-04-29 LAB — CBC
HEMATOCRIT: 37.6 % (ref 36.0–46.0)
HEMOGLOBIN: 12 g/dL (ref 12.0–15.0)
MCH: 31.3 pg (ref 26.0–34.0)
MCHC: 31.9 g/dL (ref 30.0–36.0)
MCV: 98.2 fL (ref 78.0–100.0)
PLATELETS: 217 10*3/uL (ref 150–400)
RBC: 3.83 MIL/uL — AB (ref 3.87–5.11)
RDW: 13.2 % (ref 11.5–15.5)
WBC: 9 10*3/uL (ref 4.0–10.5)

## 2014-04-29 LAB — I-STAT ARTERIAL BLOOD GAS, ED
Acid-Base Excess: 9 mmol/L — ABNORMAL HIGH (ref 0.0–2.0)
Bicarbonate: 36.7 mEq/L — ABNORMAL HIGH (ref 20.0–24.0)
O2 Saturation: 92 %
PH ART: 7.338 — AB (ref 7.350–7.450)
TCO2: 39 mmol/L (ref 0–100)
pCO2 arterial: 68.5 mmHg (ref 35.0–45.0)
pO2, Arterial: 72 mmHg — ABNORMAL LOW (ref 80.0–100.0)

## 2014-04-29 LAB — URINALYSIS, ROUTINE W REFLEX MICROSCOPIC
Glucose, UA: NEGATIVE mg/dL
Hgb urine dipstick: NEGATIVE
Ketones, ur: NEGATIVE mg/dL
Nitrite: NEGATIVE
PH: 6 (ref 5.0–8.0)
Protein, ur: NEGATIVE mg/dL
SPECIFIC GRAVITY, URINE: 1.023 (ref 1.005–1.030)
Urobilinogen, UA: 1 mg/dL (ref 0.0–1.0)

## 2014-04-29 LAB — URINE MICROSCOPIC-ADD ON

## 2014-04-29 MED ORDER — NALOXONE HCL 0.4 MG/ML IJ SOLN
0.4000 mg | Freq: Once | INTRAMUSCULAR | Status: AC
  Administered 2014-04-29: 0.4 mg via INTRAVENOUS
  Filled 2014-04-29: qty 1

## 2014-04-29 MED ORDER — TRAMADOL HCL 50 MG PO TABS: 50.0000 mg | ORAL_TABLET | Freq: Four times a day (QID) | ORAL | Status: DC | PRN

## 2014-04-29 NOTE — ED Notes (Signed)
Second attempt at report.

## 2014-04-29 NOTE — Discharge Instructions (Signed)

## 2014-04-29 NOTE — ED Notes (Signed)
To x-ray

## 2014-04-29 NOTE — ED Notes (Signed)
Pt remains alert and oriented at this time, on RA O2 levels continued to decreased into 80% range, pt placed back on O2 via [place] at 3L, MD aware.

## 2014-04-29 NOTE — ED Notes (Signed)
Returned from xray

## 2014-04-29 NOTE — ED Provider Notes (Signed)
CSN: [id number]     Arrival date & time 04/29/14  1647 History   First MD Initiated Contact with Patient 04/29/14 1710     Chief Complaint  Patient presents with  . Altered Mental Status     (Consider location/radiation/quality/duration/timing/severity/associated sxs/prior Treatment) HPI 63 year old female with past medical history of depression presents with altered mental status. Patient has recently been diagnosed with gallstones on emergency visit last night. Patient was given narcotic pain medication and per the husband was altered after the medication. Patient received Narcan last night with good reversal. Has been noted to the patient awoke this morning altered acting similarly to what she acted in the emergency department last night. Patient taking an unknown number of oxycodone pills. Patient is alert and oriented x4 but is altered and perseverating on stories about recent fall 3 weeks ago. Patient has not had headaches or been altered since the fall. Patient currently is having saturations of 85% and is on O2 in the room. Patient is not on home O2.   Past Medical History  Diagnosis Date  . Anxiety   . Bipolar 1 disorder   . Depression   . Umbilical hernia    Past Surgical History  Procedure Laterality Date  . 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 Latareces    . Thumb surgery  1977  . Joint replacement      bilateral knee  . Breast surgery      fobrocyst tumor on right breast   . Melanoma excision  may 2012    cancerous cells removed from  left side of nose   . Hernia repair  07/2011    uh with PVP, complicated by infection, mesh removal and primary repair of hernia   Family History  Problem Relation Age of Onset  . Heart disease Mother     arrythmia  . Cancer Father     leukemia   History  Substance Use Topics  . Smoking status: Never Smoker   . Smokeless tobacco: Never Used  . Alcohol Use: No   OB History   Grav Para Term Preterm Abortions TAB SAB Ect Mult  Living                 Review of Systems  Constitutional: Negative for activity change.  HENT: Negative for congestion.   Respiratory: Negative for cough and shortness of breath.   Cardiovascular: Negative for chest pain and leg swelling.  Gastrointestinal: Positive for abdominal pain. Negative for nausea, vomiting, diarrhea, constipation, blood in stool and abdominal distention.  Genitourinary: Negative for dysuria, flank pain and vaginal discharge.  Musculoskeletal: Negative for back pain.  Skin: Negative for color change.  Neurological: Negative for syncope and headaches.  Psychiatric/Behavioral: Positive for confusion. Negative for agitation.      Allergies  Adhesive  Home Medications   Prior to Admission medications   Medication Sig Start Date End Date Taking? Authorizing Provider  divalproex (DEPAKOTE ER) 500 MG 24 hr tablet Take 500-1,000 mg by mouth 2 (two) times daily. Take 1 tablet in the morning and two tablets at bed time   Yes Historical Provider, MD  fluvoxaMINE (LUVOX) 100 MG tablet Take 100 mg by mouth 2 (two) times daily.  04/19/14  Yes Historical Provider, MD  gabapentin (NEURONTIN) 100 MG capsule Take 100 mg by mouth 2 (two) times daily.  04/05/14  Yes Historical Provider, MD  levothyroxine (SYNTHROID, LEVOTHROID) 50 MCG tablet Take 50 mcg by mouth daily before breakfast.  04/16/14  Yes Historical Provider, MD  omeprazole (PRILOSEC) 20 MG capsule Take 20 mg by mouth daily.  04/11/14  Yes Historical Provider, MD  ondansetron (ZOFRAN) 4 MG tablet Take 4-8 mg by mouth every 6 (six) hours as needed for nausea.   Yes Historical Provider, MD  oxyCODONE (OXY IR/ROXICODONE) 5 MG immediate release tablet Take 5 mg by mouth every 6 (six) hours as needed for moderate pain.  04/29/14  Yes Historical Provider, MD  QUEtiapine (SEROQUEL) 100 MG tablet Take 200-300 mg by mouth at bedtime.    Yes Historical Provider, MD  clomiPRAMINE (ANAFRANIL) 25 MG capsule Take 50 mg by mouth at  bedtime.      Historical Provider, MD  clonazePAM (KLONOPIN) 0.5 MG tablet Take 0.5 mg by mouth at bedtime.     Historical Provider, MD  Diclofenac Sodium (VOLTAREN PO) Take by mouth. Patient unsure of dosage. Will confirm dosage on next visit.    Historical Provider, MD  diclofenac-misoprostol (ARTHROTEC 75) 75-0.2 MG per tablet Take 1 tablet by mouth daily at 8 pm.      Historical Provider, MD  docusate sodium (COLACE) 100 MG capsule Take 200 mg by mouth daily at 8 pm.      Historical Provider, MD   BP 139/79  Pulse 88  Temp(Src) 98.9 F (37.2 C) (Oral)  Resp 11  SpO2 97% Physical Exam  Constitutional: She is oriented to person, place, and time. She appears well-developed.  HENT:  Head: Normocephalic.  No signs of trauma to the head  Eyes: Pupils are equal, round, and reactive to light.  Neck: Neck supple.  Cardiovascular: Normal rate.  Exam reveals no gallop and no friction rub.   No murmur heard. Pulmonary/Chest: Effort normal and breath sounds normal. No respiratory distress.  Abdominal: Soft. She exhibits no distension. There is tenderness. There is no rebound.  Tenderness with deep palpation to the right upper quadrant  Musculoskeletal: She exhibits no edema.  No tenderness to C T L-spine  Neurological: She is alert and oriented to person, place, and time.  Patient alert and oriented however is perseverating and unrelated stories and hard to redirect during history taking   Skin: Skin is warm.  Psychiatric: She has a normal mood and affect.    ED Course  Procedures (including critical care time) Labs Review Labs Reviewed  CBC - Abnormal; Notable for the following:    RBC 3.83 (*)    All other components within normal limits  COMPREHENSIVE METABOLIC PANEL - Abnormal; Notable for the following:    Glucose, Bld 129 (*)    Albumin 2.9 (*)    All other components within normal limits  URINALYSIS, ROUTINE W REFLEX MICROSCOPIC - Abnormal; Notable for the following:     Color, Urine AMBER (*)    APPearance CLOUDY (*)    Bilirubin Urine SMALL (*)    Leukocytes, UA TRACE (*)    All other components within normal limits  URINE MICROSCOPIC-ADD ON - Abnormal; Notable for the following:    Casts HYALINE CASTS (*)    All other components within normal limits  I-STAT ARTERIAL BLOOD GAS, ED - Abnormal; Notable for the following:    pH, Arterial 7.338 (*)    pCO2 arterial 68.5 (*)    pO2, Arterial 72.0 (*)    Bicarbonate 36.7 (*)    Acid-Base Excess 9.0 (*)    All other components within normal limits    Imaging Review Dg Chest 2 View  04/29/2014  CLINICAL DATA:  Altered mental status  EXAM: CHEST  2 VIEW  COMPARISON:  12/28/2013  FINDINGS: Low lung volumes with mild patchy right basilar opacity, likely atelectasis. No definite pleural effusion. No pneumothorax.  The heart is top-normal in size for inspiration.  Mild degenerative changes of the visualized thoracolumbar spine.  IMPRESSION: Low lung volumes with mild patchy right basilar opacity, likely atelectasis.   Electronically Signed   By: Julian Hy M.D.   On: 04/29/2014 19:56     EKG Interpretation   Date/Time:  Thursday April 29 2014 16:54:17 EDT Ventricular Rate:  94 PR Interval:  133 QRS Duration: 70 QT Interval:  331 QTC Calculation: 414 R Axis:   27 Text Interpretation:  Sinus rhythm Low voltage, precordial leads  Borderline T abnormalities, anterior leads No significant change since  last tracing Confirmed by BEATON  MD, ROBERT ([id number]) on 04/29/2014 5:55:02  PM      MDM   Final diagnoses:  Altered mental status, unspecified altered mental status type   63 year old female with past medical history gallstones the presents with altered mental status. Per the husband the patient was treated with narcotic medication in the emergency department yesterday and required Narcan due to altered mental status. Patient found to have gallstones on emergency workup yesterday. likely underlying  cause of her right upper quadrant pain The patient took Norco at home before sleep and when she awoke with altered. This prompted called EMS.  In the department the patient had saturations of 84% off O2 saturations 99% on 4 L. Patient is alert and oriented but confused. Patient's mental status greatly improved after Narcan was administered. Patient evaluated with ABG which demonstrated a compensated respiratory acidosis. Patient states that she is in the process of being worked up for sleep apnea. She was further encouraged to continue this workup.  UA without obvious signs of infection, screening labs noncontributory, chest x-ray shows low lung volumes and basilar opacities likely representing atelectasis.   Results of workup discuss with the patient. patient is greatly improved after Narcan. Patient given a prescription for tramadol for pain control and instructed to discontinue the Norco. Patient states that she will be following up with surgery in regards to her gallbladder next week. Patient encouraged to keep this appointment and then discharged    Claudean Severance, MD 04/30/14 0113

## 2014-04-29 NOTE — ED Notes (Signed)
Pt in via EMS from home c/o altered mental status since today, husband called and stated that patient is not acting right, pt was seen yesterday at an ED in [place] and given dilaudid for abdominal pain, which she later had to receive Narcan for. Pt was given prescription for oxycodone yesterday, there are 27 pills in bottle and the dispensed quantity was only 20, so unsure how many tablets patient has taken. Pt answers questions and follows commands, appears groggy, falling asleep when not stimulated. O2 on RA reading 85%, placed on 4L upon arrival and increased to 96%. Pt unable to tell me what she has been doing today.

## 2014-05-06 NOTE — ED Provider Notes (Signed)
I saw and evaluated the patient, reviewed the resident's note and I agree with the findings and plan.   .Face to face Exam:  General:  Awake HEENT:  Atraumatic Resp:  Normal effort Abd:  Nondistended Neuro:No focal weakness   Dot Lanes, MD 05/06/14 1243

## 2014-07-12 ENCOUNTER — Other Ambulatory Visit: Payer: Self-pay

## 2014-07-12 DIAGNOSIS — Z1231 Encounter for screening mammogram for malignant neoplasm of breast: Secondary | ICD-10-CM

## 2014-07-21 ENCOUNTER — Ambulatory Visit
Admission: RE | Admit: 2014-07-21 | Discharge: 2014-07-21 | Disposition: A | Discharge status: Home / Self Care | Payer: Medicare Other | Source: Ambulatory Visit

## 2014-07-21 DIAGNOSIS — Z1231 Encounter for screening mammogram for malignant neoplasm of breast: Secondary | ICD-10-CM

## 2014-08-24 DIAGNOSIS — G4733 Obstructive sleep apnea (adult) (pediatric): Secondary | ICD-10-CM | POA: Insufficient documentation

## 2014-10-13 DIAGNOSIS — R609 Edema, unspecified: Secondary | ICD-10-CM | POA: Insufficient documentation

## 2014-10-13 DIAGNOSIS — E039 Hypothyroidism, unspecified: Secondary | ICD-10-CM | POA: Insufficient documentation

## 2015-08-03 ENCOUNTER — Other Ambulatory Visit: Payer: Self-pay

## 2015-08-03 DIAGNOSIS — Z1231 Encounter for screening mammogram for malignant neoplasm of breast: Secondary | ICD-10-CM

## 2015-08-30 ENCOUNTER — Ambulatory Visit
Admission: RE | Admit: 2015-08-30 | Discharge: 2015-08-30 | Disposition: A | Discharge status: Home / Self Care | Payer: Medicare Other | Source: Ambulatory Visit

## 2015-08-30 DIAGNOSIS — Z1231 Encounter for screening mammogram for malignant neoplasm of breast: Secondary | ICD-10-CM

## 2016-03-01 ENCOUNTER — Emergency Department (HOSPITAL_COMMUNITY)
Admission: EM | Admit: 2016-03-01 | Discharge: 2016-03-01 | Disposition: A | Discharge status: Home / Self Care | Payer: Medicare Other | Attending: Emergency Medicine | Admitting: Emergency Medicine

## 2016-03-01 ENCOUNTER — Encounter (HOSPITAL_COMMUNITY): Payer: Self-pay | Admitting: Emergency Medicine

## 2016-03-01 ENCOUNTER — Emergency Department (HOSPITAL_COMMUNITY): Payer: Medicare Other

## 2016-03-01 DIAGNOSIS — Z862 Personal history of diseases of the blood and blood-forming organs and certain disorders involving the immune mechanism: Secondary | ICD-10-CM | POA: Diagnosis not present

## 2016-03-01 DIAGNOSIS — R197 Diarrhea, unspecified: Secondary | ICD-10-CM | POA: Diagnosis not present

## 2016-03-01 DIAGNOSIS — Z79899 Other long term (current) drug therapy: Secondary | ICD-10-CM | POA: Diagnosis not present

## 2016-03-01 DIAGNOSIS — K8020 Calculus of gallbladder without cholecystitis without obstruction: Secondary | ICD-10-CM | POA: Diagnosis not present

## 2016-03-01 DIAGNOSIS — E669 Obesity, unspecified: Secondary | ICD-10-CM | POA: Diagnosis not present

## 2016-03-01 DIAGNOSIS — R1010 Upper abdominal pain, unspecified: Secondary | ICD-10-CM

## 2016-03-01 DIAGNOSIS — F319 Bipolar disorder, unspecified: Secondary | ICD-10-CM | POA: Diagnosis not present

## 2016-03-01 DIAGNOSIS — F419 Anxiety disorder, unspecified: Secondary | ICD-10-CM | POA: Diagnosis not present

## 2016-03-01 DIAGNOSIS — R112 Nausea with vomiting, unspecified: Secondary | ICD-10-CM

## 2016-03-01 HISTORY — DX: Personal history of other medical treatment: Z92.89

## 2016-03-01 HISTORY — DX: Hereditary hemolytic anemia, unspecified: D58.9

## 2016-03-01 LAB — URINALYSIS, ROUTINE W REFLEX MICROSCOPIC
Glucose, UA: NEGATIVE mg/dL
Hgb urine dipstick: NEGATIVE
KETONES UR: 40 mg/dL — AB
NITRITE: NEGATIVE
PROTEIN: 30 mg/dL — AB
Specific Gravity, Urine: 1.031 — ABNORMAL HIGH (ref 1.005–1.030)
pH: 6 (ref 5.0–8.0)

## 2016-03-01 LAB — COMPREHENSIVE METABOLIC PANEL
ALT: 36 U/L (ref 14–54)
AST: 21 U/L (ref 15–41)
Albumin: 3.7 g/dL (ref 3.5–5.0)
Alkaline Phosphatase: 49 U/L (ref 38–126)
Anion gap: 13 (ref 5–15)
BILIRUBIN TOTAL: 0.9 mg/dL (ref 0.3–1.2)
BUN: 12 mg/dL (ref 6–20)
CO2: 27 mmol/L (ref 22–32)
CREATININE: 0.77 mg/dL (ref 0.44–1.00)
Calcium: 9.2 mg/dL (ref 8.9–10.3)
Chloride: 98 mmol/L — ABNORMAL LOW (ref 101–111)
GFR calc Af Amer: >60 mL/min (ref >60)
Glucose, Bld: 133 mg/dL — ABNORMAL HIGH (ref 65–99)
Potassium: 3.7 mmol/L (ref 3.5–5.1)
Sodium: 138 mmol/L (ref 135–145)
TOTAL PROTEIN: 6.9 g/dL (ref 6.5–8.1)

## 2016-03-01 LAB — CBC
HCT: 42.9 % (ref 36.0–46.0)
Hemoglobin: 14.3 g/dL (ref 12.0–15.0)
MCH: 30.5 pg (ref 26.0–34.0)
MCHC: 33.3 g/dL (ref 30.0–36.0)
MCV: 91.5 fL (ref 78.0–100.0)
PLATELETS: 206 10*3/uL (ref 150–400)
RBC: 4.69 MIL/uL (ref 3.87–5.11)
RDW: 14 % (ref 11.5–15.5)
WBC: 6.1 10*3/uL (ref 4.0–10.5)

## 2016-03-01 LAB — URINE MICROSCOPIC-ADD ON

## 2016-03-01 LAB — LIPASE, BLOOD: Lipase: 25 U/L (ref 11–51)

## 2016-03-01 MED ORDER — ONDANSETRON HCL 4 MG/2ML IJ SOLN
4.0000 mg | Freq: Once | INTRAMUSCULAR | Status: AC
  Administered 2016-03-01: 4 mg via INTRAVENOUS
  Filled 2016-03-01: qty 2

## 2016-03-01 MED ORDER — SODIUM CHLORIDE 0.9 % IV BOLUS (SEPSIS)
1000.0000 mL | Freq: Once | INTRAVENOUS | Status: AC
  Administered 2016-03-01: 1000 mL via INTRAVENOUS

## 2016-03-01 MED ORDER — ONDANSETRON 4 MG PO TBDP: 4.0000 mg | ORAL_TABLET | Freq: Three times a day (TID) | ORAL | Status: DC | PRN

## 2016-03-01 NOTE — ED Notes (Signed)
Saw a surgeon on Tuesday for gallbladder-- surgeon did not schedule at this time.

## 2016-03-01 NOTE — ED Notes (Signed)
Pt given sprit to drink by RN

## 2016-03-01 NOTE — ED Notes (Addendum)
To ed via private vehicle with c/o abd pain, nausea, vomiting, had been seen at ED at [facility] on Saturday for same-- pt states has not been able to eat for 7 days, has not had a bowel movement for 5 days. Took a stool softener this am. Pt states having low back pain also-- taking oxycodone without relief.

## 2016-03-01 NOTE — ED Provider Notes (Signed)
CSN: [id number]     Arrival date & time 03/01/16  1345 History   First MD Initiated Contact with Patient 03/01/16 [place]     Chief Complaint  Patient presents with  . Abdominal Pain  . Nausea  . Emesis     (Consider location/radiation/quality/duration/timing/severity/associated sxs/prior Treatment) HPI  65 year old female presents with a chief complaint of vomiting after eating. This is been ongoing for about one week. Almost immediately after eating she will get upper abdominal pain under her bilateral ribs and vomit. She is not currently nauseated. She was given oral Phenergan by her PCP but this has not helped. She is trying food pretty quickly after trying the Phenergan. Currently her abdomen does not really hurt. She states she's had gallbladder problems but never had her gallbladder taken out. She went to the ER and currently is on had a negative CT scan 4 days ago. Was having the same type of pain. Has not had a bowel movement in several days and then this morning has had 3 loose stools. No fevers. Is currently having low back pain but that appears to be a more chronic issue. No urinary symptoms. Is unable to keep any fluids down.  Past Medical History  Diagnosis Date  . Anxiety   . Bipolar 1 disorder ([place])   . Depression   . Umbilical hernia   . 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 History of blood transfusion    Past Surgical History  Procedure Laterality Date  . 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 Latareces    . Thumb surgery  1977  . Joint replacement      bilateral knee  . Breast surgery      fobrocyst tumor on right breast   . Melanoma excision  may 2012    cancerous cells removed from  left side of nose   . Hernia repair  07/2011    uh with PVP, complicated by infection, mesh removal and primary repair of hernia   Family History  Problem Relation Age of Onset  . Heart disease Mother     arrythmia  . Cancer Father     leukemia   Social History  Substance Use Topics   . Smoking status: Never Smoker   . Smokeless tobacco: Never Used  . Alcohol Use: No   OB History    No data available     Review of Systems  Constitutional: Negative for fever.  Cardiovascular: Negative for chest pain.  Gastrointestinal: Positive for nausea, vomiting, abdominal pain and diarrhea (today). Negative for blood in stool.  Genitourinary: Negative for dysuria and hematuria.  Musculoskeletal: Positive for back pain.  All other systems reviewed and are negative.     Allergies  Adhesive  Home Medications   Prior to Admission medications   Medication Sig Start Date End Date Taking? Authorizing Provider  divalproex (DEPAKOTE ER) 500 MG 24 hr tablet Take 500-1,000 mg by mouth 2 (two) times daily. Take 1 tablet in the morning and two tablets at bed time    Historical Provider, MD  fluvoxaMINE (LUVOX) 100 MG tablet Take 100 mg by mouth 2 (two) times daily.  04/19/14   Historical Provider, MD  gabapentin (NEURONTIN) 100 MG capsule Take 100 mg by mouth 2 (two) times daily.  04/05/14   Historical Provider, MD  levothyroxine (SYNTHROID, LEVOTHROID) 50 MCG tablet Take 50 mcg by mouth daily before breakfast.  04/16/14   Historical Provider, MD  omeprazole (PRILOSEC) 20 MG capsule Take  20 mg by mouth daily.  04/11/14   Historical Provider, MD  ondansetron (ZOFRAN) 4 MG tablet Take 4-8 mg by mouth every 6 (six) hours as needed for nausea.    Historical Provider, MD  oxyCODONE (OXY IR/ROXICODONE) 5 MG immediate release tablet Take 5 mg by mouth every 6 (six) hours as needed for moderate pain.  04/29/14   Historical Provider, MD  QUEtiapine (SEROQUEL) 100 MG tablet Take 200-300 mg by mouth at bedtime.     Historical Provider, MD  traMADol (ULTRAM) 50 MG tablet Take 1 tablet (50 mg total) by mouth every 6 (six) hours as needed. 04/29/14   Claudean Severance, MD   BP 146/82 mmHg  Pulse 103  Temp(Src) 97.9 F (36.6 C) (Oral)  Resp 18  Ht 5\' 4"  (1.626 m)  Wt 271 lb (122.925 kg)  BMI 46.49 kg/m2   SpO2 100% Physical Exam  Constitutional: She is oriented to person, place, and time. She appears well-developed and well-nourished.  Obese, no distress  HENT:  Head: Normocephalic and atraumatic.  Right Ear: External ear normal.  Left Ear: External ear normal.  Nose: Nose normal.  Eyes: Right eye exhibits no discharge. Left eye exhibits no discharge.  Cardiovascular: Normal rate, regular rhythm and normal heart sounds.   Pulmonary/Chest: Effort normal and breath sounds normal.  Abdominal: Soft. She exhibits no distension. There is tenderness (soft, non focal but vaguely tender in multiple locations, mostly lower).  Neurological: She is alert and oriented to person, place, and time.  Skin: Skin is warm and dry.  Nursing note and vitals reviewed.   ED Course  Procedures (including critical care time) Labs Review Labs Reviewed  COMPREHENSIVE METABOLIC PANEL - Abnormal; Notable for the following:    Chloride 98 (*)    Glucose, Bld 133 (*)    All other components within normal limits  URINALYSIS, ROUTINE W REFLEX MICROSCOPIC (NOT AT [facility]) - Abnormal; Notable for the following:    Color, Urine AMBER (*)    APPearance CLOUDY (*)    Specific Gravity, Urine 1.031 (*)    Bilirubin Urine MODERATE (*)    Ketones, ur 40 (*)    Protein, ur 30 (*)    Leukocytes, UA TRACE (*)    All other components within normal limits  URINE MICROSCOPIC-ADD ON - Abnormal; Notable for the following:    Squamous Epithelial / LPF 0-5 (*)    Bacteria, UA RARE (*)    All other components within normal limits  LIPASE, BLOOD  CBC    Imaging Review US Abdomen Limited  03/01/2016  CLINICAL DATA:  65 year old female with right upper quadrant abdominal pain EXAM: US ABDOMEN LIMITED - RIGHT UPPER QUADRANT COMPARISON:  Abdominal CT dated 08/22/2011 FINDINGS: Gallbladder: There are multiple stones within the gallbladder. There is no gallbladder wall thickening or pericholecystic fluid. Common bile duct: Diameter: 6  mm Liver: No focal lesion identified. Within normal limits in parenchymal echogenicity. There is a 3.1 x 2.5 x 2.8 cm hypoechoic, cystic appearing structure superior to the head of the pancreas and medial to the left lobe of the liver. Doppler images do not demonstrate flow within this structure. This may represent a cyst arising from the liver, a pancreatic pseudocyst, or a choledochal cyst. A cystic/necrotic lesion or lymph node is not excluded. Further evaluation with CT or MRI without and with contrast is recommended. This lesion does not demonstrate bowel wall signature and therefore a duodenal diverticulum is less likely. IMPRESSION: Cholelithiasis without sonographic evidence of  acute cholecystitis. Hypoechoic/cystic appearing structure superior to the head of the pancreas of indeterminate etiology. Further evaluation with CT or MRI without or with contrast recommended. Electronically Signed   By: Anner Crete M.D.   On: 03/01/2016 21:34     2017 May CT Abdomen Pelvis W Contrast5/03/2016  [facility]  Result Impression  IMPRESSION: Lymphadenopathy and mild splenomegaly concerning for lymphoproliferative disease, or less likely metastatic disease.  No acute findings.    I have personally reviewed and evaluated these images and lab results as part of my medical decision-making.   EKG Interpretation None      MDM   Final diagnoses:  Upper abdominal pain  Calculus of gallbladder without cholecystitis without obstruction  Nausea and vomiting in adult    Patient's workup shows no emergent pathology. She has cholelithiasis but no cholecystitis. She feels like her abdomen is more distended after IV fluids but on repeat exam her abdomen is still soft. I do not think repeat CT imaging is warranted given negative CT a few days ago during the same pain course. Likely because this pain is worse with eating and in her upper abdomen it is likely symptomatic cholelithiasis. However right now  she is able to eat and drink after IV Zofran. I will discharge her with this. She are to has a Psychologist, sport and exercise as an outpatient and I have encouraged her to follow-up closely with them for possible outpatient cholecystectomy. Discussed return precautions.    Sherwood Gambler, MD 03/02/16 [phone]

## 2016-03-01 NOTE — ED Notes (Signed)
Patient transported to Ultrasound 

## 2016-03-13 DIAGNOSIS — C8590 Non-Hodgkin lymphoma, unspecified, unspecified site: Secondary | ICD-10-CM | POA: Insufficient documentation

## 2016-03-22 ENCOUNTER — Other Ambulatory Visit: Payer: Self-pay | Admitting: General Surgery

## 2016-03-22 DIAGNOSIS — R1900 Intra-abdominal and pelvic swelling, mass and lump, unspecified site: Secondary | ICD-10-CM

## 2016-03-23 ENCOUNTER — Other Ambulatory Visit: Payer: Self-pay | Admitting: General Surgery

## 2016-03-23 DIAGNOSIS — R1900 Intra-abdominal and pelvic swelling, mass and lump, unspecified site: Secondary | ICD-10-CM

## 2016-04-02 ENCOUNTER — Ambulatory Visit
Admission: RE | Admit: 2016-04-02 | Discharge: 2016-04-02 | Disposition: A | Discharge status: Home / Self Care | Payer: Medicare Other | Source: Ambulatory Visit | Attending: General Surgery | Admitting: General Surgery

## 2016-04-02 DIAGNOSIS — R1900 Intra-abdominal and pelvic swelling, mass and lump, unspecified site: Secondary | ICD-10-CM

## 2016-04-02 MED ORDER — GADOBENATE DIMEGLUMINE 529 MG/ML IV SOLN
20.0000 mL | Freq: Once | INTRAVENOUS | Status: AC | PRN
  Administered 2016-04-02: 20 mL via INTRAVENOUS

## 2016-05-10 DIAGNOSIS — R161 Splenomegaly, not elsewhere classified: Secondary | ICD-10-CM | POA: Insufficient documentation

## 2016-10-18 ENCOUNTER — Other Ambulatory Visit: Payer: Self-pay | Admitting: Physician Assistant

## 2016-10-18 DIAGNOSIS — Z1231 Encounter for screening mammogram for malignant neoplasm of breast: Secondary | ICD-10-CM

## 2016-11-14 ENCOUNTER — Ambulatory Visit
Admission: RE | Admit: 2016-11-14 | Discharge: 2016-11-14 | Disposition: A | Discharge status: Home / Self Care | Payer: Medicare Other | Source: Ambulatory Visit | Attending: Physician Assistant | Admitting: Physician Assistant

## 2016-11-14 DIAGNOSIS — Z1231 Encounter for screening mammogram for malignant neoplasm of breast: Secondary | ICD-10-CM

## 2017-06-19 ENCOUNTER — Other Ambulatory Visit: Payer: Self-pay | Admitting: Internal Medicine

## 2017-09-10 ENCOUNTER — Other Ambulatory Visit: Payer: Self-pay

## 2017-09-10 DIAGNOSIS — I739 Peripheral vascular disease, unspecified: Secondary | ICD-10-CM

## 2017-11-05 ENCOUNTER — Ambulatory Visit (HOSPITAL_COMMUNITY)
Admission: RE | Admit: 2017-11-05 | Discharge: 2017-11-05 | Disposition: A | Discharge status: Home / Self Care | Payer: Medicare Other | Source: Ambulatory Visit | Attending: Vascular Surgery | Admitting: Vascular Surgery

## 2017-11-05 ENCOUNTER — Encounter: Payer: Self-pay | Admitting: Vascular Surgery

## 2017-11-05 ENCOUNTER — Ambulatory Visit (INDEPENDENT_AMBULATORY_CARE_PROVIDER_SITE_OTHER): Payer: Medicare Other | Admitting: Vascular Surgery

## 2017-11-05 VITALS — BP 119/70 | HR 75 | Resp 18 | Ht 64.0 in | Wt 291.0 lb

## 2017-11-05 DIAGNOSIS — R5381 Other malaise: Secondary | ICD-10-CM | POA: Diagnosis not present

## 2017-11-05 DIAGNOSIS — I739 Peripheral vascular disease, unspecified: Secondary | ICD-10-CM | POA: Diagnosis not present

## 2017-11-05 NOTE — Progress Notes (Signed)
History of Present Illness:  Patient is a 67 y.o. year old female who presents for evaluation of PAD.  She had a home health nurse visit and the nurse could not palpate her pulses in the left LE, but could on the right.  She denise calf, buttock and hip pain at a stated distance consistently.   She states her walking is limited to deconditioning and shortness of breath.  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   She has become very deconditioned through out her treatment program.  Atherosclerotic risk factors and other medical problems include imobility, Non smoker, Bipolar, COPD.  She denise DM, hypercholesterolemia and hypertension.    Past Medical History:  Diagnosis Date  . Anxiety   . Bipolar 1 disorder ([place])   . Cancer ([place])   . Depression   . 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 History of blood transfusion   . Umbilical hernia     Past Surgical History:  Procedure Laterality Date  . 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 HERNIA REPAIR  07/2011   uh with PVP, complicated by infection, mesh removal and primary repair of hernia  . JOINT REPLACEMENT     bilateral knee  . latareces    . MELANOMA EXCISION  may 2012   cancerous cells removed from  left side of nose   . thumb surgery  1977    ROS:   General:  No weight loss, Fever, chills  HEENT: No recent headaches, no nasal bleeding, no visual changes, no sore throat  Neurologic: No dizziness, blackouts, seizures. No recent symptoms of stroke or mini- stroke. No recent episodes of slurred speech, or temporary blindness.  Cardiac: No recent episodes of chest pain/pressure, no shortness of breath at rest.  No shortness of breath with exertion.  Denies history of atrial fibrillation or irregular heartbeat  Vascular: No history of rest pain in feet.  No history of claudication.  No history of non-healing ulcer, No history of DVT    Pulmonary: No home oxygen, no productive cough, no hemoptysis,  No asthma or wheezing  Musculoskeletal:  [ ]  Arthritis, [ ]  Low back pain,  [ ]  Joint pain  Hematologic:No history of hypercoagulable state.  No history of easy bleeding.  No history of anemia  Gastrointestinal: No hematochezia or melena,  No gastroesophageal reflux, no trouble swallowing  Urinary: [ ]  chronic Kidney disease, [ ]  on HD - [ ]  MWF or [ ]  TTHS, [ ]  Burning with urination, [ ]  Frequent urination, [ ]  Difficulty urinating;   Skin: No rashes  Psychological: No history of anxiety,  No history of depression  Social History Social History   Tobacco Use  . Smoking status: Never Smoker  . Smokeless tobacco: Never Used  Substance Use Topics  . Alcohol use: No  . Drug use: No    Family History Family History  Problem Relation Age of Onset  . Heart disease Mother        arrythmia  . Cancer Father        leukemia    Allergies  Allergies  Allergen Reactions  . Adhesive [Tape] Itching and Rash     Current Outpatient Medications  Medication Sig Dispense Refill  . divalproex (DEPAKOTE ER) 500 MG 24 hr tablet Take 1,000 mg by mouth at bedtime.     . fluvoxaMINE (  LUVOX) 100 MG tablet Take 100 mg by mouth 2 (two) times daily.     [name] gabapentin (NEURONTIN) 100 MG capsule Take 100-200 mg by mouth 2 (two) times daily. Take 100 mg every morning  Take 200 mg every night    . levothyroxine (SYNTHROID, LEVOTHROID) 50 MCG tablet Take 50 mcg by mouth daily before breakfast.     . QUEtiapine (SEROQUEL) 100 MG tablet Take 200 mg by mouth at bedtime.     . ondansetron (ZOFRAN ODT) 4 MG disintegrating tablet Take 1 tablet (4 mg total) by mouth every 8 (eight) hours as needed for nausea or vomiting. 10 tablet 0  . oxyCODONE (OXY IR/ROXICODONE) 5 MG immediate release tablet Take 5 mg by mouth every 6 (six) hours as needed for moderate pain.     . promethazine (PHENERGAN) 25 MG tablet Take 25 mg by mouth every 6 (six)  hours as needed for nausea or vomiting.    . VENTOLIN HFA 108 (90 Base) MCG/ACT inhaler   0   No current facility-administered medications for this visit.     Physical Examination  Vitals:   11/05/17 1532  BP: 119/70  Pulse: 75  Resp: 18  SpO2: 96%  Weight: 291 lb (132 kg)  Height: 5\' 4"  (1.626 m)    Body mass index is 49.95 kg/m.  General:  Alert and oriented, no acute distress HEENT: Normal Neck: No bruit or JVD Pulmonary: Clear to auscultation bilaterally Cardiac: Regular Rate and Rhythm without murmur Abdomen: firm, non-tender, non-distended, no mass,no scars Skin: No rash Extremity Pulses:  2+ radial,  femoral, dorsalis pedis pulses bilaterally Musculoskeletal: No deformity or edema  Neurologic: Upper and lower extremity motor grossly 4+/5 and symmetric  DATA:  ABI's  And arterial duplex Right > 1 TBI >1 with triphasic flow Left > 1 TBI> 1 and triphasic flow   ASSESSMENT:  Normal arterial Blood flow    PLAN:  Essentially she has a normal arterial exam.  No symptoms of claudication, non healing wounds, or rest pain.  She is however deconditioned.  I suggested she start some sort of exercise program that she and her husband can do together.  She could start off simple and do chair aerobics using her computer and You tube channel.  I also suggested she re start a daily baby aspirin for heart and vascular protection.  She will f/u on an as need basis in the future.    Roxy Horseman PA-C [facility]  The patient was seen in conjunction with Dr. Donnetta Hutching today

## 2017-12-09 ENCOUNTER — Other Ambulatory Visit: Payer: Self-pay | Admitting: Family Medicine

## 2017-12-09 DIAGNOSIS — Z1231 Encounter for screening mammogram for malignant neoplasm of breast: Secondary | ICD-10-CM

## 2017-12-10 ENCOUNTER — Ambulatory Visit
Admission: RE | Admit: 2017-12-10 | Discharge: 2017-12-10 | Disposition: A | Discharge status: Home / Self Care | Payer: Medicare Other | Source: Ambulatory Visit | Attending: Family Medicine | Admitting: Family Medicine

## 2017-12-10 DIAGNOSIS — Z1231 Encounter for screening mammogram for malignant neoplasm of breast: Secondary | ICD-10-CM

## 2018-03-06 ENCOUNTER — Other Ambulatory Visit: Payer: Self-pay | Admitting: Family Medicine

## 2018-03-06 DIAGNOSIS — R609 Edema, unspecified: Secondary | ICD-10-CM

## 2018-03-11 ENCOUNTER — Ambulatory Visit
Admission: RE | Admit: 2018-03-11 | Discharge: 2018-03-11 | Disposition: A | Discharge status: Home / Self Care | Payer: Medicare Other | Source: Ambulatory Visit | Attending: Family Medicine | Admitting: Family Medicine

## 2018-03-11 DIAGNOSIS — R609 Edema, unspecified: Secondary | ICD-10-CM

## 2018-11-06 ENCOUNTER — Other Ambulatory Visit: Payer: Self-pay | Admitting: Family Medicine

## 2018-11-06 DIAGNOSIS — Z1231 Encounter for screening mammogram for malignant neoplasm of breast: Secondary | ICD-10-CM

## 2018-12-12 ENCOUNTER — Ambulatory Visit
Admission: RE | Admit: 2018-12-12 | Discharge: 2018-12-12 | Disposition: A | Discharge status: Home / Self Care | Payer: Medicare Other | Source: Ambulatory Visit | Attending: Family Medicine | Admitting: Family Medicine

## 2018-12-12 DIAGNOSIS — Z1231 Encounter for screening mammogram for malignant neoplasm of breast: Secondary | ICD-10-CM

## 2018-12-16 ENCOUNTER — Encounter (INDEPENDENT_AMBULATORY_CARE_PROVIDER_SITE_OTHER): Payer: Self-pay

## 2018-12-30 ENCOUNTER — Encounter (INDEPENDENT_AMBULATORY_CARE_PROVIDER_SITE_OTHER): Payer: Self-pay | Admitting: Bariatrics

## 2018-12-30 DIAGNOSIS — F319 Bipolar disorder, unspecified: Secondary | ICD-10-CM | POA: Insufficient documentation

## 2018-12-31 ENCOUNTER — Encounter (INDEPENDENT_AMBULATORY_CARE_PROVIDER_SITE_OTHER): Payer: Self-pay | Admitting: Bariatrics

## 2018-12-31 ENCOUNTER — Ambulatory Visit (INDEPENDENT_AMBULATORY_CARE_PROVIDER_SITE_OTHER): Payer: Medicare Other | Admitting: Bariatrics

## 2018-12-31 ENCOUNTER — Other Ambulatory Visit: Payer: Self-pay

## 2018-12-31 VITALS — BP 116/74 | HR 82 | Temp 97.6°F | Ht 64.0 in | Wt 294.0 lb

## 2018-12-31 DIAGNOSIS — Z1331 Encounter for screening for depression: Secondary | ICD-10-CM | POA: Diagnosis not present

## 2018-12-31 DIAGNOSIS — F319 Bipolar disorder, unspecified: Secondary | ICD-10-CM

## 2018-12-31 DIAGNOSIS — R0602 Shortness of breath: Secondary | ICD-10-CM | POA: Diagnosis not present

## 2018-12-31 DIAGNOSIS — Z6841 Body Mass Index (BMI) 40.0 and over, adult: Secondary | ICD-10-CM

## 2018-12-31 DIAGNOSIS — R5383 Other fatigue: Secondary | ICD-10-CM

## 2018-12-31 DIAGNOSIS — E038 Other specified hypothyroidism: Secondary | ICD-10-CM

## 2018-12-31 DIAGNOSIS — Z0289 Encounter for other administrative examinations: Secondary | ICD-10-CM

## 2018-12-31 DIAGNOSIS — R609 Edema, unspecified: Secondary | ICD-10-CM

## 2018-12-31 DIAGNOSIS — E559 Vitamin D deficiency, unspecified: Secondary | ICD-10-CM

## 2018-12-31 DIAGNOSIS — R739 Hyperglycemia, unspecified: Secondary | ICD-10-CM

## 2018-12-31 NOTE — Progress Notes (Signed)
.  Office: [phone]  /  Fax: [phone]   HPI:   Chief Complaint: OBESITY  Yolanda Chavez (MR# [id number]) is a 68 y.o. female who presents on 12/31/2018 for obesity evaluation and treatment. Current BMI is Body mass index is 50.46 kg/m.[name] Yolanda Chavez has struggled with obesity for years and has been unsuccessful in either losing weight or maintaining long term weight loss. Yolanda Chavez attended our information session and states she is currently in the action stage of change and ready to dedicate time achieving and maintaining a healthier weight.  Yolanda Chavez states her family eats meals together she thinks her family will eat healthier with  her her desired weight loss is 129 lbs. she started gaining weight in 1995 her heaviest weight ever was 304 lbs. she has significant food cravings issues  she snacks frequently in the evenings she skips meals frequently she is frequently drinking liquids with calories she frequently makes poor food choices she has problems with excessive hunger  she frequently eats larger portions than normal  she has binge eating behaviors she struggles with emotional eating    Fatigue Yolanda Chavez feels her energy is lower than it should be. This has worsened with weight gain and has not worsened recently. Yolanda Chavez admits to daytime somnolence and she denies waking up still tired. Patient is at risk for obstructive sleep apnea. Patent has a history of symptoms of daytime fatigue. Patient generally gets 12 hours of sleep per night, and states they generally have generally restful sleep. Snoring is present. Apneic episodes are present. Epworth Sleepiness Score is 6  Dyspnea on exertion Yolanda Chavez notes increasing shortness of breath with certain activities (with walking) and seems to be worsening over time with weight gain. She notes getting out of breath sooner with activity than she used to. This has not gotten worse recently. Yolanda Chavez admits orthopnea.  Hypothyroidism Yolanda Chavez  has a diagnosis of hypothyroidism. She is taking levothyroxine. She is currently asymptomatic. Her hypothyroidism is currently controlled.  Peripheral Edema Yolanda Chavez has peripheral edema and she is doing better.  Bipolar Disorder Yolanda Chavez has a diagnosis of bipolar disorder and she takes Depakote, Luvox, Seroquel and Gabapentin. She sees a psychiatrist every three months. PHQ-9 score is 12.  Elevated Blood Glucose Yolanda Chavez has a history of some elevated blood glucose readings without a diagnosis of diabetes. She denies polyphagia.  Vitamin D deficiency Yolanda Chavez has a diagnosis of vitamin D deficiency. She is not currently taking vit D and denies nausea, vomiting or muscle weakness.  Depression Screen Yolanda Chavez Food and Mood (modified PHQ-9) score was  Depression screen PHQ 2/9 12/31/2018  Decreased Interest 3  Down, Depressed, Hopeless 1  PHQ - 2 Score 4  Altered sleeping 0  Tired, decreased energy 3  Change in appetite 2  Feeling bad or failure about yourself  0  Trouble concentrating 3  Moving slowly or fidgety/restless 0  Suicidal thoughts 0  PHQ-9 Score 12  Difficult doing work/chores Not difficult at all    ASSESSMENT AND PLAN:  Other fatigue - Plan: EKG 12-Lead  Shortness of breath on exertion  Other specified hypothyroidism  Peripheral edema  Bipolar affective disorder, remission status unspecified (HCC)  Vitamin D deficiency - Plan: VITAMIN D 25 Hydroxy (Vit-D Deficiency, Fractures)  Blood glucose elevated - Plan: Comprehensive metabolic panel, Hemoglobin A1c, Insulin, random  Depression screening  Class 3 severe obesity with serious comorbidity and body mass index (BMI) of 50.0 to 59.9 in adult, unspecified obesity type (HCC)  PLAN:  Fatigue Yolanda Chavez  was informed that her fatigue may be related to obesity, depression or many other causes. Labs will be ordered, and in the meanwhile Yolanda Chavez has agreed to work on diet, exercise and weight loss to help with fatigue.  Proper sleep hygiene was discussed including the need for 7-8 hours of quality sleep each night. A sleep study was not ordered based on symptoms and Epworth score.  Dyspnea on exertion Yolanda Chavez's shortness of breath appears to be obesity related and exercise induced. She has agreed to work on weight loss and gradually increase exercise to treat her exercise induced shortness of breath. Yolanda Chavez will not do pounding exercises. If [name] follows our instructions and loses weight without improvement of her shortness of breath, we will plan to refer to pulmonology. We will monitor this condition regularly. Yolanda Chavez agrees to this plan.  Hypothyroidism Yolanda Chavez was informed of the importance of good thyroid control to help with weight loss efforts. She was also informed that supertherapeutic thyroid levels are dangerous and will not improve weight loss results. Yolanda Chavez will continue her medications and follow up as directed.   Peripheral Edema We will follow her peripheral edema over time. Yolanda Chavez agrees to follow up with our clinic at the agreed upon time.  Bipolar Disorder Yolanda Chavez will follow up with her psychiatrist. She will follow up with our clinic at the agreed upon time.  Elevated Blood Glucose Fasting labs will be obtained (Hgb A1c, insulin level) and results with be discussed with Yolanda Chavez in 2 weeks at her follow up visit. In the meanwhile Yolanda Chavez was started on a lower simple carbohydrate diet and will work on weight loss efforts.  Vitamin D Deficiency Yolanda Chavez was informed that low vitamin D levels contributes to fatigue and are associated with obesity, breast, and colon cancer. We will check vitamin D level today and she will follow up for routine testing of vitamin D, at least 2-3 times per year.   Depression Screen Yolanda Chavez had a moderately positive depression screening. Depression is commonly associated with obesity and often results in emotional eating behaviors. We will monitor this closely and  work on CBT to help improve the non-hunger eating patterns. Referral to Psychology may be required if no improvement is seen as she continues in our clinic.  Obesity Yolanda Chavez is currently in the action stage of change and her goal is to continue with weight loss efforts She has agreed to follow the Category 3 plan Yolanda Chavez has been instructed to work up to a goal of 150 minutes of combined cardio and strengthening exercise per week for weight loss and overall health benefits. We discussed the following Behavioral Modification Strategies today: decrease nighttime eating, increase H2O intake, no skipping meals, keeping healthy foods in the home, increasing lean protein intake, decreasing simple carbohydrates, increasing vegetables, decrease eating out, work on meal planning and easy cooking plans and decrease liquid calories  Yolanda Chavez has agreed to follow up with our clinic in 2 weeks. She was informed of the importance of frequent follow up visits to maximize her success with intensive lifestyle modifications for her multiple health conditions. She was informed we would discuss her lab results at her next visit unless there is a critical issue that needs to be addressed sooner. Yolanda Chavez agreed to keep her next visit at the agreed upon time to discuss these results.  ALLERGIES: Allergies  Allergen Reactions   Adhesive [Tape] Itching and Rash    MEDICATIONS: Current Outpatient Medications on File Prior to Visit  Medication Sig Dispense Refill  acetaminophen (TYLENOL) 325 MG tablet Take 325 mg by mouth as needed.     divalproex (DEPAKOTE ER) 500 MG 24 hr tablet Take 1,000 mg by mouth at bedtime.      fluvoxaMINE (LUVOX) 100 MG tablet Take 100 mg by mouth 2 (two) times daily.      gabapentin (NEURONTIN) 100 MG capsule Take 100-200 mg by mouth 2 (two) times daily. Take 100 mg every morning  Take 200 mg every night     levocetirizine (XYZAL) 5 MG tablet Take 5 mg by mouth every evening.      levothyroxine (SYNTHROID, LEVOTHROID) 50 MCG tablet Take 50 mcg by mouth daily before breakfast.      montelukast (SINGULAIR) 10 MG tablet Take 10 mg by mouth at bedtime.     QUEtiapine (SEROQUEL) 100 MG tablet Take 200 mg by mouth at bedtime.      sucralfate (CARAFATE) 1 g tablet Take 1 g by mouth 4 (four) times daily -  with meals and at bedtime.     VENTOLIN HFA 108 (90 Base) MCG/ACT inhaler   0   ondansetron (ZOFRAN ODT) 4 MG disintegrating tablet Take 1 tablet (4 mg total) by mouth every 8 (eight) hours as needed for nausea or vomiting. (Patient not taking: Reported on 12/31/2018) 10 tablet 0   oxyCODONE (OXY IR/ROXICODONE) 5 MG immediate release tablet Take 5 mg by mouth every 6 (six) hours as needed for moderate pain.      promethazine (PHENERGAN) 25 MG tablet Take 25 mg by mouth every 6 (six) hours as needed for nausea or vomiting.     No current facility-administered medications on file prior to visit.     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   BREAST CYST EXCISION Right    BREAST SURGERY     fobrocyst tumor on right breast    gall stones     HERNIA REPAIR  07/2011   uh with PVP, complicated by infection, mesh removal and primary repair of hernia   JOINT REPLACEMENT     bilateral knee   latareces     MELANOMA EXCISION  may 2012   cancerous cells removed from  left side of nose    thumb surgery  1977   TUBAL LIGATION      SOCIAL HISTORY: Social History   Tobacco Use   Smoking status: Never Smoker    Smokeless tobacco: Never Used  Substance Use Topics   Alcohol use: No   Drug use: No    FAMILY HISTORY: Family History  Problem Relation Age of Onset   Heart disease Mother        arrythmia   Cancer Mother    Anxiety disorder Mother    Cancer Father        leukemia   Depression Father    Diabetes Father    High blood pressure Father     ROS: Review of Systems  HENT: Positive for hearing loss and tinnitus.        + Dry Mouth  Eyes:       + Wear Glasses or Contacts + Floaters  Respiratory: Positive for shortness of breath.   Cardiovascular:       + Shortness of Breath with activity + Calf/Leg Pain with walking  Gastrointestinal: Negative for nausea and vomiting.  Musculoskeletal: Positive for back pain.       Negative for muscle weakness  Skin:       + Dryness  Neurological: Positive for tremors.  Endo/Heme/Allergies:       Negative for polyphagia  Psychiatric/Behavioral: Positive for depression. The patient is nervous/anxious (nervousness) and has insomnia.        + Stress    PHYSICAL EXAM: Blood pressure 116/74, pulse 82, temperature 97.6 F (36.4 C), temperature source Oral, height 5\' 4"  (1.626 m), weight 294 lb (133.4 kg), SpO2 96 %. Body mass index is 50.46 kg/m. Physical Exam Vitals signs reviewed.  Constitutional:      Appearance: Normal appearance. She is well-developed. She is obese.  HENT:     Head: Normocephalic and atraumatic.     Nose: Nose normal.     Mouth/Throat:     Comments: Mallampati = 4 Eyes:     General: No scleral icterus.    Extraocular Movements: Extraocular movements intact.  Neck:     Musculoskeletal: Normal range of motion and neck supple.     Thyroid: No thyromegaly.  Cardiovascular:     Rate and Rhythm: Normal rate and regular rhythm.  Pulmonary:     Effort: Pulmonary effort is normal. No respiratory distress.  Abdominal:     Palpations: Abdomen is soft.     Tenderness: There is no abdominal tenderness.    Musculoskeletal: Normal range of motion.     Right lower leg: Edema (trace) present.     Left lower leg: Edema (trace) present.     Comments: Range of Motion normal in all 4 extremities  Skin:    General: Skin is warm and dry.     Comments: Dry Skin on lower legs  Neurological:     Mental Status: She is alert and oriented to person, place, and time.     Coordination: Coordination normal.  Psychiatric:        Mood and Affect: Mood normal.        Behavior: Behavior normal.     RECENT LABS AND TESTS: BMET    Component Value Date/Time   NA 138 03/01/2016 1450   K 3.7 03/01/2016 1450   CL 98 (L) 03/01/2016 1450   CO2 27 03/01/2016 1450   GLUCOSE 133 (H) 03/01/2016 1450   BUN 12 03/01/2016 1450   CREATININE 0.77 03/01/2016 1450   CREATININE 0.72 08/21/2011 1723   CALCIUM 9.2 03/01/2016 1450   GFRNONAA >60 03/01/2016 1450   GFRAA >60 03/01/2016 1450   No results found for: HGBA1C No results found for: INSULIN CBC    Component Value Date/Time   WBC 6.1 03/01/2016 1450   RBC 4.69 03/01/2016 1450   HGB 14.3 03/01/2016 1450   HCT 42.9 03/01/2016 1450   PLT 206 03/01/2016 1450   MCV 91.5 03/01/2016 1450   MCH 30.5 03/01/2016 1450   MCHC 33.3 03/01/2016 1450   RDW 14.0 03/01/2016 1450   LYMPHSABS 2.3 08/21/2011 1723   MONOABS 0.5 08/21/2011 1723   EOSABS 0.2 08/21/2011 1723   BASOSABS 0.1 08/21/2011 1723   Iron/TIBC/Ferritin/ %Sat No  results found for: IRON, TIBC, FERRITIN, IRONPCTSAT Lipid Panel  No results found for: CHOL, TRIG, HDL, CHOLHDL, VLDL, LDLCALC, LDLDIRECT Hepatic Function Panel     Component Value Date/Time   PROT 6.9 03/01/2016 1450   ALBUMIN 3.7 03/01/2016 1450   AST 21 03/01/2016 1450   ALT 36 03/01/2016 1450   ALKPHOS 49 03/01/2016 1450   BILITOT 0.9 03/01/2016 1450   No results found for: TSH Vitamin D There are no recent lab results  ECG  shows NSR with a rate of 84 BPM INDIRECT CALORIMETER done today shows a VO2 of 309 and a REE of  2155. Her calculated basal metabolic rate is 6578 thus her basal metabolic rate is better than expected.   OBESITY BEHAVIORAL INTERVENTION VISIT  Today's visit was # 1   Starting weight: 294 lbs Starting date: 12/31/2018 Today's weight : 294 lbs Today's date: 12/31/2018 Total lbs lost to date: 0 At least 15 minutes were spent on discussing the following behavioral intervention visit.    12/31/2018  Height 5\' 4"  (1.626 m)  Weight 294 lb (133.4 kg)  BMI (Calculated) 50.44  BLOOD PRESSURE - SYSTOLIC 469  BLOOD PRESSURE - DIASTOLIC 74  Waist Measurement  57 inches   Body Fat % 56.7 %  RMR 2155    ASK: We discussed the diagnosis of obesity with Yolanda Chavez Vosler today and Ashonte agreed to give [name] permission to discuss obesity behavioral modification therapy today.  ASSESS: Tinika has the diagnosis of obesity and her BMI today is 50.44 Yolanda Chavez is in the action stage of change   ADVISE: Shawnice was educated on the multiple health risks of obesity as well as the benefit of weight loss to improve her health. She was advised of the need for long term treatment and the importance of lifestyle modifications to improve her current health and to decrease her risk of future health problems.  AGREE: Multiple dietary modification options and treatment options were discussed and  Mirielle agreed to follow the recommendations documented in the above note.  ARRANGE: Terrace was educated on the importance of frequent visits to treat obesity as outlined per CMS and USPSTF guidelines and agreed to schedule her next follow up appointment today.   Corey Skains, am acting as Location manager for [employer]. Owens Shark, DO  I have reviewed the above documentation for accuracy and completeness, and I agree with the above. -Jearld Lesch, DO

## 2019-01-01 ENCOUNTER — Encounter (INDEPENDENT_AMBULATORY_CARE_PROVIDER_SITE_OTHER): Payer: Self-pay | Admitting: Bariatrics

## 2019-01-01 DIAGNOSIS — Z6841 Body Mass Index (BMI) 40.0 and over, adult: Secondary | ICD-10-CM | POA: Insufficient documentation

## 2019-01-01 LAB — COMPREHENSIVE METABOLIC PANEL
A/G RATIO: 1.8 (ref 1.2–2.2)
ALBUMIN: 4.3 g/dL (ref 3.8–4.8)
ALK PHOS: 86 IU/L (ref 39–117)
ALT: 25 IU/L (ref 0–32)
AST: 18 IU/L (ref 0–40)
BILIRUBIN TOTAL: 0.5 mg/dL (ref 0.0–1.2)
BUN / CREAT RATIO: 17 (ref 12–28)
BUN: 11 mg/dL (ref 8–27)
CHLORIDE: 98 mmol/L (ref 96–106)
CO2: 27 mmol/L (ref 20–29)
Calcium: 9.7 mg/dL (ref 8.7–10.3)
Creatinine, Ser: 0.65 mg/dL (ref 0.57–1.00)
GFR calc non Af Amer: 91 mL/min/{1.73_m2} (ref >59)
GFR, EST AFRICAN AMERICAN: 105 mL/min/{1.73_m2} (ref >59)
GLOBULIN, TOTAL: 2.4 g/dL (ref 1.5–4.5)
Glucose: 121 mg/dL — ABNORMAL HIGH (ref 65–99)
POTASSIUM: 4 mmol/L (ref 3.5–5.2)
SODIUM: 143 mmol/L (ref 134–144)
TOTAL PROTEIN: 6.7 g/dL (ref 6.0–8.5)

## 2019-01-01 LAB — HEMOGLOBIN A1C
Est. average glucose Bld gHb Est-mCnc: 114 mg/dL
HEMOGLOBIN A1C: 5.6 % (ref 4.8–5.6)

## 2019-01-01 LAB — VITAMIN D 25 HYDROXY (VIT D DEFICIENCY, FRACTURES): Vit D, 25-Hydroxy: 10.8 ng/mL — ABNORMAL LOW (ref 30.0–100.0)

## 2019-01-01 LAB — INSULIN, RANDOM: INSULIN: 12.7 u[IU]/mL (ref 2.6–24.9)

## 2019-01-13 ENCOUNTER — Encounter (INDEPENDENT_AMBULATORY_CARE_PROVIDER_SITE_OTHER): Payer: Self-pay

## 2019-01-14 ENCOUNTER — Ambulatory Visit (INDEPENDENT_AMBULATORY_CARE_PROVIDER_SITE_OTHER): Payer: Medicare Other | Admitting: Bariatrics

## 2019-01-14 ENCOUNTER — Other Ambulatory Visit: Payer: Self-pay

## 2019-01-14 ENCOUNTER — Encounter (INDEPENDENT_AMBULATORY_CARE_PROVIDER_SITE_OTHER): Payer: Self-pay

## 2019-01-14 DIAGNOSIS — E8881 Metabolic syndrome: Secondary | ICD-10-CM

## 2019-01-14 DIAGNOSIS — Z6841 Body Mass Index (BMI) 40.0 and over, adult: Secondary | ICD-10-CM

## 2019-01-14 DIAGNOSIS — E559 Vitamin D deficiency, unspecified: Secondary | ICD-10-CM

## 2019-01-15 NOTE — Progress Notes (Addendum)
Office: [phone]  /  Fax: [phone] TeleHealth Visit:  Yolanda Chavez has consented to this TeleHealth visit today via telephone call. The patient is located at home, the provider is located at the [facility] office. The participants in this visit include the listed provider, patient, husband, daughter, grandkids and any and all parties involved.   HPI:   Chief Complaint: OBESITY Yolanda Chavez is here to discuss her progress with her obesity treatment plan. She is on the Category 3 plan and is following her eating plan approximately 50 % of the time. She states she is exercising 0 minutes 0 times per week. Yolanda Chavez is unsure if she has lost weight. She did go on vacation and other activities. Yolanda Chavez did not like the following: "boring". She is having trouble with meat and bread. Yolanda Chavez is not having any cravings. She is not weighing her meat. Her weight at home was 293 pounds. We were unable to weight the patient today for this TeleHealth visit.She is unsure if she has lost weight since her last visit.   Vitamin D deficiency Keviana has a diagnosis of vitamin D deficiency. Yolanda Chavez has taken the vit D in the past. Her last vitamin D level was at 10.8 and she denies nausea, vomiting or muscle weakness.  Insulin Resistance Yolanda Chavez has a diagnosis of mild insulin resistance based on her elevated fasting insulin level >5. Although Yolanda Chavez's blood glucose readings are still under good control, insulin resistance puts her at greater risk of metabolic syndrome and diabetes. Her last A1c was at 5.6 and last insulin level was at 13.2 She is not taking metformin currently and continues to work on diet and exercise to decrease risk of diabetes.  ASSESSMENT AND PLAN:  Vitamin D deficiency - Plan: Vitamin D, Ergocalciferol, (DRISDOL) 1.25 MG (50000 UT) CAPS capsule  Insulin resistance  Class 3 severe obesity with serious comorbidity and body mass index (BMI) of 50.0 to 59.9 in adult,  unspecified obesity type (Yolanda Chavez)  PLAN:  Vitamin D Deficiency Yolanda Chavez was informed that low vitamin D levels contributes to fatigue and are associated with obesity, breast, and colon cancer. She agrees to continue to take prescription Vit D @50 ,000 IU every week #4 with no refills and will follow up for routine testing of vitamin D, at least 2-3 times per year. She was informed of the risk of over-replacement of vitamin D and agrees to not increase her dose unless she discusses this with [name] first. Yolanda Chavez agrees to follow up with our clinic in 2 weeks.  Insulin Resistance Yolanda Chavez will continue to work on weight loss, exercise, and decreasing simple carbohydrates in her diet to help decrease the risk of diabetes. We dicussed insulin resistance with myself and our registered dietician. She was informed that eating too many simple carbohydrates or too many calories at one sitting increases the likelihood of GI side effects. Yolanda Chavez agreed to follow up with [name] as directed to monitor her progress.  Obesity Yolanda Chavez is currently in the action stage of change. As such, her goal is to continue with weight loss efforts She has agreed to follow the Category 3 plan Yolanda Chavez has been instructed to work up to a goal of 150 minutes of combined cardio and strengthening exercise per week for weight loss and overall health benefits. We discussed the following Behavioral Modification Strategies today: increase H2O intake to 64+ ounces, no skipping meals, keeping healthy foods in the home, increasing lean protein intake, decreasing simple carbohydrates, increasing vegetables, decrease  eating out and work on meal planning and easy cooking plans Yolanda Chavez was given options: bread, lunch and recipes.  Yolanda Chavez has agreed to follow up with our clinic in 2 weeks. She was informed of the importance of frequent follow up visits to maximize her success with intensive lifestyle modifications for her multiple health conditions.  ALLERGIES:  Allergies  Allergen Reactions  . Adhesive [Tape] Itching and Rash    MEDICATIONS: Current Outpatient Medications on File Prior to Visit  Medication Sig Dispense Refill  . acetaminophen (TYLENOL) 325 MG tablet Take 325 mg by mouth as needed.    . divalproex (DEPAKOTE ER) 500 MG 24 hr tablet Take 1,000 mg by mouth at bedtime.     . fluvoxaMINE (LUVOX) 100 MG tablet Take 100 mg by mouth 2 (two) times daily.     [name] gabapentin (NEURONTIN) 100 MG capsule Take 100-200 mg by mouth 2 (two) times daily. Take 100 mg every morning  Take 200 mg every night    . levocetirizine (XYZAL) 5 MG tablet Take 5 mg by mouth every evening.    [name] levothyroxine (SYNTHROID, LEVOTHROID) 50 MCG tablet Take 50 mcg by mouth daily before breakfast.     . montelukast (SINGULAIR) 10 MG tablet Take 10 mg by mouth at bedtime.    . ondansetron (ZOFRAN ODT) 4 MG disintegrating tablet Take 1 tablet (4 mg total) by mouth every 8 (eight) hours as needed for nausea or vomiting. (Patient not taking: Reported on 12/31/2018) 10 tablet 0  . oxyCODONE (OXY IR/ROXICODONE) 5 MG immediate release tablet Take 5 mg by mouth every 6 (six) hours as needed for moderate pain.     . promethazine (PHENERGAN) 25 MG tablet Take 25 mg by mouth every 6 (six) hours as needed for nausea or vomiting.    [name] QUEtiapine (SEROQUEL) 100 MG tablet Take 200 mg by mouth at bedtime.     . sucralfate (CARAFATE) 1 g tablet Take 1 g by mouth 4 (four) times daily -  with meals and at bedtime.    . VENTOLIN HFA 108 (90 Base) MCG/ACT inhaler   0   No current facility-administered medications on file prior to visit.     PAST MEDICAL HISTORY: Past Medical History:  Diagnosis Date  . Anemia   . Anxiety   . Back pain   . Bipolar 1 disorder ([place])   . Cancer ([place])   . Depression   . Edema, lower extremity   . Gallbladder problem   . GERD (gastroesophageal reflux disease)   . 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 History of blood transfusion   . Hypothyroidism   . Sleep  apnea   . Umbilical hernia     PAST SURGICAL HISTORY: Past Surgical History:  Procedure Laterality Date  . 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 BREAST CYST EXCISION Right   . 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 gall stones    . HERNIA REPAIR  07/2011   uh with PVP, complicated by infection, mesh removal and primary repair of hernia  . JOINT REPLACEMENT     bilateral knee  . latareces    . MELANOMA EXCISION  may 2012   cancerous cells removed from  left side of nose   . thumb surgery  1977  . TUBAL LIGATION      SOCIAL HISTORY: Social History   Tobacco Use  . Smoking status: Never Smoker  . Smokeless tobacco: Never  Used  Substance Use Topics  . Alcohol use: No  . Drug use: No    FAMILY HISTORY: Family History  Problem Relation Age of Onset  . Heart disease Mother        arrythmia  . Cancer Mother   . Anxiety disorder Mother   . Cancer Father        leukemia  . Depression Father   . Diabetes Father   . High blood pressure Father     ROS: Review of Systems  Gastrointestinal: Negative for nausea and vomiting.  Musculoskeletal:       Negative for muscle weakness    PHYSICAL EXAM: Pt in no acute distress  RECENT LABS AND TESTS: BMET    Component Value Date/Time   NA 143 12/31/2018 1410   K 4.0 12/31/2018 1410   CL 98 12/31/2018 1410   CO2 27 12/31/2018 1410   GLUCOSE 121 (H) 12/31/2018 1410   GLUCOSE 133 (H) 03/01/2016 1450   BUN 11 12/31/2018 1410   CREATININE 0.65 12/31/2018 1410   CREATININE 0.72 08/21/2011 1723   CALCIUM 9.7 12/31/2018 1410   GFRNONAA 91 12/31/2018 1410   GFRAA 105 12/31/2018 1410   Lab Results  Component Value Date   HGBA1C 5.6 12/31/2018   Lab Results  Component Value Date   INSULIN 12.7 12/31/2018   CBC    Component Value Date/Time   WBC 6.1 03/01/2016 1450   RBC 4.69 03/01/2016 1450   HGB 14.3 03/01/2016 1450   HCT 42.9 03/01/2016 1450   PLT 206 03/01/2016 1450   MCV 91.5 03/01/2016  1450   MCH 30.5 03/01/2016 1450   MCHC 33.3 03/01/2016 1450   RDW 14.0 03/01/2016 1450   LYMPHSABS 2.3 08/21/2011 1723   MONOABS 0.5 08/21/2011 1723   EOSABS 0.2 08/21/2011 1723   BASOSABS 0.1 08/21/2011 1723   Iron/TIBC/Ferritin/ %Sat No results found for: IRON, TIBC, FERRITIN, IRONPCTSAT Lipid Panel  No results found for: CHOL, TRIG, HDL, CHOLHDL, VLDL, LDLCALC, LDLDIRECT Hepatic Function Panel     Component Value Date/Time   PROT 6.7 12/31/2018 1410   ALBUMIN 4.3 12/31/2018 1410   AST 18 12/31/2018 1410   ALT 25 12/31/2018 1410   ALKPHOS 86 12/31/2018 1410   BILITOT 0.5 12/31/2018 1410   No results found for: TSH   Ref. Range 12/31/2018 14:10  Vitamin D, 25-Hydroxy Latest Ref Range: 30.0 - 100.0 ng/mL 10.8 (L)     I, Doreene Nest, am acting as Location manager for [employer]. Owens Shark, DO  I have reviewed the above documentation for accuracy and completeness, and I agree with the above. -Jearld Lesch, DO

## 2019-01-19 DIAGNOSIS — E8881 Metabolic syndrome: Secondary | ICD-10-CM | POA: Insufficient documentation

## 2019-01-19 DIAGNOSIS — E559 Vitamin D deficiency, unspecified: Secondary | ICD-10-CM | POA: Insufficient documentation

## 2019-01-19 MED ORDER — VITAMIN D (ERGOCALCIFEROL) 1.25 MG (50000 UNIT) PO CAPS: 50000.0000 [IU] | ORAL_CAPSULE | ORAL | 0 refills | Status: DC

## 2019-01-29 ENCOUNTER — Ambulatory Visit (INDEPENDENT_AMBULATORY_CARE_PROVIDER_SITE_OTHER): Payer: Medicare Other | Admitting: Bariatrics

## 2019-01-29 ENCOUNTER — Encounter (INDEPENDENT_AMBULATORY_CARE_PROVIDER_SITE_OTHER): Payer: Self-pay | Admitting: Bariatrics

## 2019-01-29 ENCOUNTER — Other Ambulatory Visit: Payer: Self-pay

## 2019-01-29 DIAGNOSIS — E8881 Metabolic syndrome: Secondary | ICD-10-CM

## 2019-01-29 DIAGNOSIS — E559 Vitamin D deficiency, unspecified: Secondary | ICD-10-CM | POA: Diagnosis not present

## 2019-01-29 DIAGNOSIS — Z6841 Body Mass Index (BMI) 40.0 and over, adult: Secondary | ICD-10-CM | POA: Diagnosis not present

## 2019-02-02 NOTE — Progress Notes (Signed)
Office: [phone]  /  Fax: [phone] TeleHealth Visit:  Nayra Coury has verbally consented to this TeleHealth visit today. The patient is located at home, the provider is located at the [facility] office. The participants in this visit include the listed provider and patient and any and all partied involved. The visit was conducted today via FaceTime.  HPI:   Chief Complaint: OBESITY Emanie is here to discuss her progress with her obesity treatment plan. She is on the Category 3 plan and is following her eating plan approximately 80 % of the time. She states she is exercising 0 minutes 0 times per week. Crystallee thinks that she has lost 1 pound. She has been able to find most foods. We were unable to weigh the patient today for this TeleHealth visit. She feels as if she has lost weight since her last visit. She has lost 1 lb since starting treatment with [name].  Vitamin D deficiency Roseann has a diagnosis of vitamin D deficiency. She is taking high dose vit D and denies nausea, vomiting or muscle weakness.  Insulin Resistance Aradhana has a diagnosis of insulin resistance based on her elevated fasting insulin level >5. Although Estoria's blood glucose readings are still under good control, insulin resistance puts her at greater risk of metabolic syndrome and diabetes. Her last A1c was at 5.6 and last insulin level was at 12.7 She is not taking medications currently and continues to work on diet and exercise to decrease risk of diabetes.  ASSESSMENT AND PLAN:  Vitamin D deficiency  Insulin resistance  Class 3 severe obesity with serious comorbidity and body mass index (BMI) of 50.0 to 59.9 in adult, unspecified obesity type ([place])  PLAN:  Vitamin D Deficiency Joleene was informed that low vitamin D levels contributes to fatigue and are associated with obesity, breast, and colon cancer. She will continue to take prescription Vit D @50 ,000 IU every week and will  follow up for routine testing of vitamin D, at least 2-3 times per year. She was informed of the risk of over-replacement of vitamin D and agrees to not increase her dose unless she discusses this with [name] first.  Insulin Resistance Aylla will continue to work on weight loss, exercise, increasing lean protein and decreasing simple carbohydrates in her diet to help decrease the risk of diabetes. She was informed that eating too many simple carbohydrates or too many calories at one sitting increases the likelihood of GI side effects. Adore agreed to follow up with [name] as directed to monitor her progress.  Obesity Laurene is currently in the action stage of change. As such, her goal is to continue with weight loss efforts She has agreed to follow the Category 3 plan Zoe has been instructed to work up to a goal of 150 minutes of combined cardio and strengthening exercise per week for weight loss and overall health benefits. We discussed the following Behavioral Modification Strategies today: increase H2O intake, no skipping meals, keeping healthy foods in the home, increasing lean protein intake, decreasing simple carbohydrates, increasing vegetables, decrease eating out and work on meal planning and easy cooking plans Neeti will weigh herself at home.  Taya has agreed to follow up with our clinic in 2 weeks. She was informed of the importance of frequent follow up visits to maximize her success with intensive lifestyle modifications for her multiple health conditions.  ALLERGIES: Allergies  Allergen Reactions  . Adhesive [Tape] Itching and Rash    MEDICATIONS: Current  Outpatient Medications on File Prior to Visit  Medication Sig Dispense Refill  . acetaminophen (TYLENOL) 325 MG tablet Take 325 mg by mouth as needed.    . divalproex (DEPAKOTE ER) 500 MG 24 hr tablet Take 1,000 mg by mouth at bedtime.     . fluvoxaMINE (LUVOX) 100 MG tablet Take 100 mg by mouth 2 (two) times daily.     [name]  gabapentin (NEURONTIN) 100 MG capsule Take 100-200 mg by mouth 2 (two) times daily. Take 100 mg every morning  Take 200 mg every night    . levocetirizine (XYZAL) 5 MG tablet Take 5 mg by mouth every evening.    [name] levothyroxine (SYNTHROID, LEVOTHROID) 50 MCG tablet Take 50 mcg by mouth daily before breakfast.     . montelukast (SINGULAIR) 10 MG tablet Take 10 mg by mouth at bedtime.    . ondansetron (ZOFRAN ODT) 4 MG disintegrating tablet Take 1 tablet (4 mg total) by mouth every 8 (eight) hours as needed for nausea or vomiting. (Patient not taking: Reported on 12/31/2018) 10 tablet 0  . oxyCODONE (OXY IR/ROXICODONE) 5 MG immediate release tablet Take 5 mg by mouth every 6 (six) hours as needed for moderate pain.     . promethazine (PHENERGAN) 25 MG tablet Take 25 mg by mouth every 6 (six) hours as needed for nausea or vomiting.    [name] QUEtiapine (SEROQUEL) 100 MG tablet Take 200 mg by mouth at bedtime.     . sucralfate (CARAFATE) 1 g tablet Take 1 g by mouth 4 (four) times daily -  with meals and at bedtime.    . VENTOLIN HFA 108 (90 Base) MCG/ACT inhaler   0  . Vitamin D, Ergocalciferol, (DRISDOL) 1.25 MG (50000 UT) CAPS capsule Take 1 capsule (50,000 Units total) by mouth every 7 (seven) days. 4 capsule 0   No current facility-administered medications on file prior to visit.     PAST MEDICAL HISTORY: Past Medical History:  Diagnosis Date  . Anemia   . Anxiety   . Back pain   . Bipolar 1 disorder ([place])   . Cancer ([place])   . Depression   . Edema, lower extremity   . Gallbladder problem   . GERD (gastroesophageal reflux disease)   . 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 History of blood transfusion   . Hypothyroidism   . Sleep apnea   . Umbilical hernia     PAST SURGICAL HISTORY: Past Surgical History:  Procedure Laterality Date  . 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 BREAST CYST EXCISION Right   . 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 gall stones    . HERNIA REPAIR   07/2011   uh with PVP, complicated by infection, mesh removal and primary repair of hernia  . JOINT REPLACEMENT     bilateral knee  . latareces    . MELANOMA EXCISION  may 2012   cancerous cells removed from  left side of nose   . thumb surgery  1977  . TUBAL LIGATION      SOCIAL HISTORY: Social History   Tobacco Use  . Smoking status: Never Smoker  . Smokeless tobacco: Never Used  Substance Use Topics  . Alcohol use: No  . Drug use: No    FAMILY HISTORY: Family History  Problem Relation Age of Onset  . Heart disease Mother        arrythmia  . Cancer Mother   .  Anxiety disorder Mother   . Cancer Father        leukemia  . Depression Father   . Diabetes Father   . High blood pressure Father     ROS: Review of Systems  Constitutional: Positive for weight loss.  Gastrointestinal: Negative for nausea and vomiting.  Musculoskeletal:       Negative for muscle weakness    PHYSICAL EXAM: Pt in no acute distress  RECENT LABS AND TESTS: BMET    Component Value Date/Time   NA 143 12/31/2018 1410   K 4.0 12/31/2018 1410   CL 98 12/31/2018 1410   CO2 27 12/31/2018 1410   GLUCOSE 121 (H) 12/31/2018 1410   GLUCOSE 133 (H) 03/01/2016 1450   BUN 11 12/31/2018 1410   CREATININE 0.65 12/31/2018 1410   CREATININE 0.72 08/21/2011 1723   CALCIUM 9.7 12/31/2018 1410   GFRNONAA 91 12/31/2018 1410   GFRAA 105 12/31/2018 1410   Lab Results  Component Value Date   HGBA1C 5.6 12/31/2018   Lab Results  Component Value Date   INSULIN 12.7 12/31/2018   CBC    Component Value Date/Time   WBC 6.1 03/01/2016 1450   RBC 4.69 03/01/2016 1450   HGB 14.3 03/01/2016 1450   HCT 42.9 03/01/2016 1450   PLT 206 03/01/2016 1450   MCV 91.5 03/01/2016 1450   MCH 30.5 03/01/2016 1450   MCHC 33.3 03/01/2016 1450   RDW 14.0 03/01/2016 1450   LYMPHSABS 2.3 08/21/2011 1723   MONOABS 0.5 08/21/2011 1723   EOSABS 0.2 08/21/2011 1723   BASOSABS 0.1 08/21/2011 1723    Iron/TIBC/Ferritin/ %Sat No results found for: IRON, TIBC, FERRITIN, IRONPCTSAT Lipid Panel  No results found for: CHOL, TRIG, HDL, CHOLHDL, VLDL, LDLCALC, LDLDIRECT Hepatic Function Panel     Component Value Date/Time   PROT 6.7 12/31/2018 1410   ALBUMIN 4.3 12/31/2018 1410   AST 18 12/31/2018 1410   ALT 25 12/31/2018 1410   ALKPHOS 86 12/31/2018 1410   BILITOT 0.5 12/31/2018 1410   No results found for: TSH    Ref. Range 12/31/2018 14:10  Vitamin D, 25-Hydroxy Latest Ref Range: 30.0 - 100.0 ng/mL 10.8 (L)    I, Doreene Nest, am acting as Location manager for [employer]. Owens Shark, DO  I have reviewed the above documentation for accuracy and completeness, and I agree with the above. -Jearld Lesch, DO

## 2019-02-12 ENCOUNTER — Encounter (INDEPENDENT_AMBULATORY_CARE_PROVIDER_SITE_OTHER): Payer: Self-pay | Admitting: Bariatrics

## 2019-02-12 ENCOUNTER — Other Ambulatory Visit: Payer: Self-pay

## 2019-02-12 ENCOUNTER — Ambulatory Visit (INDEPENDENT_AMBULATORY_CARE_PROVIDER_SITE_OTHER): Payer: Medicare Other | Admitting: Bariatrics

## 2019-02-12 DIAGNOSIS — F5089 Other specified eating disorder: Secondary | ICD-10-CM

## 2019-02-12 DIAGNOSIS — K5909 Other constipation: Secondary | ICD-10-CM | POA: Diagnosis not present

## 2019-02-12 DIAGNOSIS — E559 Vitamin D deficiency, unspecified: Secondary | ICD-10-CM

## 2019-02-12 DIAGNOSIS — Z6841 Body Mass Index (BMI) 40.0 and over, adult: Secondary | ICD-10-CM

## 2019-02-12 DIAGNOSIS — E8881 Metabolic syndrome: Secondary | ICD-10-CM | POA: Diagnosis not present

## 2019-02-12 MED ORDER — VITAMIN D (ERGOCALCIFEROL) 1.25 MG (50000 UNIT) PO CAPS: 50000.0000 [IU] | ORAL_CAPSULE | ORAL | 0 refills | Status: DC

## 2019-02-12 MED ORDER — TOPIRAMATE 50 MG PO TABS: 50.0000 mg | ORAL_TABLET | Freq: Every day | ORAL | 0 refills | Status: DC

## 2019-02-12 NOTE — Progress Notes (Signed)
Office: [phone]  /  Fax: [phone] TeleHealth Visit:  Fleur Audino has verbally consented to this TeleHealth visit today. The patient is located at home, the provider is located at the [facility] office. The participants in this visit include the listed provider and patient. The visit was conducted today via Face Time.  HPI:   Chief Complaint: OBESITY Yolanda Chavez is here to discuss her progress with her obesity treatment plan. She is on the Category 3 plan and is following her eating plan approximately 10 % of the time. She states she is exercising 0 minutes 0 times per week. Shareeka states that she has lost 2 pounds and is at 291 pounds. Tasnim is struggling with the diet. She is binging at night time only. Ary is not getting in all of her food.  We were unable to weigh the patient today for this TeleHealth visit. She feels as if she has lost weight since her last visit. She has lost 0 lbs since starting treatment with [name].  Vitamin D deficiency Zanaiya has a diagnosis of vitamin D deficiency. She is currently taking vit D and denies nausea, vomiting, or muscle weakness.  Insulin Resistance Lacee has a diagnosis of insulin resistance based on her elevated fasting insulin level of 12.7 and her A1c was 5.6 on 12/31/18. Although Ailani's blood glucose readings are still under good control, insulin resistance puts her at greater risk of metabolic syndrome and diabetes. She is not taking medications currently and continues to work on diet and exercise to decrease risk of diabetes.  Constipation Hadlie notes constipation for the last few weeks, worse since attempting weight loss. She denies the urge to go. She has seen GI in the recent past. Arbie is eating some raw vegetables.  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 She denies glaucoma or absolute and relative contraindications.  ASSESSMENT AND  PLAN:  Vitamin D deficiency - Plan: Vitamin D, Ergocalciferol, (DRISDOL) 1.25 MG (50000 UT) CAPS capsule  Insulin resistance  Other constipation  Other disorder of eating - night time - Plan: topiramate (TOPAMAX) 50 MG tablet  Class 3 severe obesity with serious comorbidity and body mass index (BMI) of 50.0 to 59.9 in adult, unspecified obesity type ([place])  PLAN:  Vitamin D Deficiency Dellie was informed that low vitamin D levels contribute to fatigue and are associated with obesity, breast, and colon cancer. Jerrilynn agrees to continue to take prescription Vit D @50 ,000 IU every week #4 with no refills and will follow up for routine testing of vitamin D, at least 2-3 times per year. She was informed of the risk of over-replacement of vitamin D and agrees to not increase her dose unless she discusses this with [name] first. Elzada agrees to follow up in 2 weeks as directed.  Insulin Resistance Rena will continue to work on weight loss, exercise, and decreasing simple carbohydrates in her diet to help decrease the risk of diabetes. She was informed that eating too many simple carbohydrates or too many calories at one sitting increases the likelihood of GI side effects. Willis agreed to continue reducing carbohydrates and increasing protein in her diet. She will consider exercise and Katryn agreed to follow up with [name] as directed to monitor her progress.   Constipation Noam was informed decrease bowel movement frequency is normal while losing weight, but stools should not be hard or painful. She was advised to increase her raw vegetables and  H20 intake. She may mix ground flax seed in cereal or yogurt and work on increasing her fiber intake. High fiber foods were discussed today. Shatana will follow up at the agreed upon time.  Night Time Eating Disorder Eria agrees to start Topamax 50 mg 1 PO qd #30 with no refills and she will follow up in 2 weeks.  Obesity Saleah is currently in the action  stage of change. As such, her goal is to continue with weight loss efforts. She has agreed to follow the Category 3 plan with increase protein intake and meal planning. Anselma will weigh herself at home. Carolyne has been instructed to work up to a goal of 150 minutes of combined cardio and strengthening exercise per week for weight loss and overall health benefits. We discussed the following Behavioral Modification Strategies today: increasing lean protein intake, decreasing simple carbohydrates, increasing vegetables, increase H2O intake, no skipping meals, keeping healthy foods in the home, decrease eating out, planning for success, and work on meal planning and easy cooking plans.  Arlinda has agreed to follow up with our clinic in 2 weeks. She was informed of the importance of frequent follow up visits to maximize her success with intensive lifestyle modifications for her multiple health conditions.  ALLERGIES: Allergies  Allergen Reactions  . Adhesive [Tape] Itching and Rash    MEDICATIONS: Current Outpatient Medications on File Prior to Visit  Medication Sig Dispense Refill  . acetaminophen (TYLENOL) 325 MG tablet Take 325 mg by mouth as needed.    . divalproex (DEPAKOTE ER) 500 MG 24 hr tablet Take 1,000 mg by mouth at bedtime.     . fluvoxaMINE (LUVOX) 100 MG tablet Take 100 mg by mouth 2 (two) times daily.     [name] gabapentin (NEURONTIN) 100 MG capsule Take 100-200 mg by mouth 2 (two) times daily. Take 100 mg every morning  Take 200 mg every night    . levocetirizine (XYZAL) 5 MG tablet Take 5 mg by mouth every evening.    [name] levothyroxine (SYNTHROID, LEVOTHROID) 50 MCG tablet Take 50 mcg by mouth daily before breakfast.     . montelukast (SINGULAIR) 10 MG tablet Take 10 mg by mouth at bedtime.    . ondansetron (ZOFRAN ODT) 4 MG disintegrating tablet Take 1 tablet (4 mg total) by mouth every 8 (eight) hours as needed for nausea or vomiting. (Patient not taking: Reported on 12/31/2018) 10  tablet 0  . oxyCODONE (OXY IR/ROXICODONE) 5 MG immediate release tablet Take 5 mg by mouth every 6 (six) hours as needed for moderate pain.     . promethazine (PHENERGAN) 25 MG tablet Take 25 mg by mouth every 6 (six) hours as needed for nausea or vomiting.    [name] QUEtiapine (SEROQUEL) 100 MG tablet Take 200 mg by mouth at bedtime.     . sucralfate (CARAFATE) 1 g tablet Take 1 g by mouth 4 (four) times daily -  with meals and at bedtime.    . VENTOLIN HFA 108 (90 Base) MCG/ACT inhaler   0   No current facility-administered medications on file prior to visit.     PAST MEDICAL HISTORY: Past Medical History:  Diagnosis Date  . Anemia   . Anxiety   . Back pain   . Bipolar 1 disorder ([place])   . Cancer ([place])   . Depression   . Edema, lower extremity   . Gallbladder problem   . GERD (gastroesophageal reflux disease)   . 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  History of blood transfusion   . Hypothyroidism   . Sleep apnea   . Umbilical hernia     PAST SURGICAL HISTORY: Past Surgical History:  Procedure Laterality Date  . 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 BREAST CYST EXCISION Right   . 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 gall stones    . HERNIA REPAIR  07/2011   uh with PVP, complicated by infection, mesh removal and primary repair of hernia  . JOINT REPLACEMENT     bilateral knee  . latareces    . MELANOMA EXCISION  may 2012   cancerous cells removed from  left side of nose   . thumb surgery  1977  . TUBAL LIGATION      SOCIAL HISTORY: Social History   Tobacco Use  . Smoking status: Never Smoker  . Smokeless tobacco: Never Used  Substance Use Topics  . Alcohol use: No  . Drug use: No    FAMILY HISTORY: Family History  Problem Relation Age of Onset  . Heart disease Mother        arrythmia  . Cancer Mother   . Anxiety disorder Mother   . Cancer Father        leukemia  . Depression Father   . Diabetes Father   . High blood pressure Father      ROS: Review of Systems  Eyes:       Negative for glaucoma.  Gastrointestinal: Positive for constipation. Negative for nausea and vomiting.  Musculoskeletal:       Negative for muscle weakness.    PHYSICAL EXAM: Pt in no acute distress  RECENT LABS AND TESTS: BMET    Component Value Date/Time   NA 143 12/31/2018 1410   K 4.0 12/31/2018 1410   CL 98 12/31/2018 1410   CO2 27 12/31/2018 1410   GLUCOSE 121 (H) 12/31/2018 1410   GLUCOSE 133 (H) 03/01/2016 1450   BUN 11 12/31/2018 1410   CREATININE 0.65 12/31/2018 1410   CREATININE 0.72 08/21/2011 1723   CALCIUM 9.7 12/31/2018 1410   GFRNONAA 91 12/31/2018 1410   GFRAA 105 12/31/2018 1410   Lab Results  Component Value Date   HGBA1C 5.6 12/31/2018   Lab Results  Component Value Date   INSULIN 12.7 12/31/2018   CBC    Component Value Date/Time   WBC 6.1 03/01/2016 1450   RBC 4.69 03/01/2016 1450   HGB 14.3 03/01/2016 1450   HCT 42.9 03/01/2016 1450   PLT 206 03/01/2016 1450   MCV 91.5 03/01/2016 1450   MCH 30.5 03/01/2016 1450   MCHC 33.3 03/01/2016 1450   RDW 14.0 03/01/2016 1450   LYMPHSABS 2.3 08/21/2011 1723   MONOABS 0.5 08/21/2011 1723   EOSABS 0.2 08/21/2011 1723   BASOSABS 0.1 08/21/2011 1723   Iron/TIBC/Ferritin/ %Sat No results found for: IRON, TIBC, FERRITIN, IRONPCTSAT Lipid Panel  No results found for: CHOL, TRIG, HDL, CHOLHDL, VLDL, LDLCALC, LDLDIRECT Hepatic Function Panel     Component Value Date/Time   PROT 6.7 12/31/2018 1410   ALBUMIN 4.3 12/31/2018 1410   AST 18 12/31/2018 1410   ALT 25 12/31/2018 1410   ALKPHOS 86 12/31/2018 1410   BILITOT 0.5 12/31/2018 1410   No results found for: TSH  Results for CONCEPTION, DOEBLER (MRN [id number]) as of 02/12/2019 12:42  Ref. Range 12/31/2018 14:10  Vitamin D, 25-Hydroxy Latest Ref Range: 30.0 - 100.0 ng/mL 10.8 (L)  I, Marcille Blanco, CMA, am acting as Location manager for [employer]. Owens Shark, DO  I have reviewed the above documentation for  accuracy and completeness, and I agree with the above. -Jearld Lesch, DO

## 2019-02-26 ENCOUNTER — Ambulatory Visit (INDEPENDENT_AMBULATORY_CARE_PROVIDER_SITE_OTHER): Payer: Medicare Other | Admitting: Bariatrics

## 2019-02-26 ENCOUNTER — Other Ambulatory Visit: Payer: Self-pay

## 2019-02-26 ENCOUNTER — Encounter (INDEPENDENT_AMBULATORY_CARE_PROVIDER_SITE_OTHER): Payer: Self-pay | Admitting: Bariatrics

## 2019-02-26 DIAGNOSIS — Z6841 Body Mass Index (BMI) 40.0 and over, adult: Secondary | ICD-10-CM

## 2019-02-26 DIAGNOSIS — F5089 Other specified eating disorder: Secondary | ICD-10-CM | POA: Diagnosis not present

## 2019-02-26 DIAGNOSIS — E8881 Metabolic syndrome: Secondary | ICD-10-CM | POA: Diagnosis not present

## 2019-03-02 NOTE — Progress Notes (Signed)
Office: [phone]  /  Fax: [phone] TeleHealth Visit:  Yolanda Chavez has verbally consented to this TeleHealth visit today. The patient is located at home, the provider is located at the [facility] office. The participants in this visit include the listed provider and patient and any and all parties involved. The visit was conducted today via FaceTime.  HPI:   Chief Complaint: OBESITY Yolanda Chavez is here to discuss her progress with her obesity treatment plan. She is on the Category 3 plan and is following her eating plan approximately 90 % of the time. She states she is exercising 0 minutes 0 times per week. Yolanda Chavez has lost 4 pounds since her last visit. She was started on Topamax at her last visit. She is doing well with protein. We were unable to weigh the patient today for this TeleHealth visit. She feels as if she has lost weight since her last visit. She has lost 8 lbs since starting treatment with [name].  Insulin Resistance Yolanda Chavez has a diagnosis of insulin resistance based on her elevated fasting insulin level >5. Although Yolanda Chavez's blood glucose readings are still under good control, insulin resistance puts her at greater risk of metabolic syndrome and diabetes. Her last A1c was at 5.6 and last insulin level was at 12.7 She is not taking medications and continues to work on diet and exercise to decrease risk of diabetes.  Night-time eating behaviors Yolanda Chavez has night time eating behaviors and was started on Topamax at the last visit. Yolanda Chavez denies any side effects of Topamax.  ASSESSMENT AND PLAN:  Insulin resistance  Other disorder of eating - night time  Class 3 severe obesity with serious comorbidity and body mass index (BMI) of 50.0 to 59.9 in adult, unspecified obesity type ([place])  PLAN:  Insulin Resistance Yolanda Chavez will continue to work on weight loss, increasing lean protein and decreasing simple carbohydrates in her diet to help decrease the risk of  diabetes. She will consider exercise. She was informed that eating too many simple carbohydrates or too many calories at one sitting increases the likelihood of GI side effects. Yolanda Chavez agreed to follow up with [name] as directed to monitor her progress.  Night-time eating behaviors Yolanda Chavez will continue the Topamax and follow up with our clinic in 2 weeks.  Obesity Yolanda Chavez is currently in the action stage of change. As such, her goal is to continue with weight loss efforts She has agreed to follow the Category 3 plan Yolanda Chavez will consider exercise for weight loss and overall health benefits. We discussed the following Behavioral Modification Strategies today: planning for success, increase H2O intake, no skipping meals, keeping healthy foods in the home, better snacking choices,increasing lean protein intake, decreasing simple carbohydrates, increasing vegetables, decrease eating out, work on meal planning and intentional eating, emotional eating strategies, ways to avoid boredom eating and ways to avoid night time snacking Yolanda Chavez will weigh herself at home before each visit.  Yolanda Chavez has agreed to follow up with our clinic in 2 weeks. She was informed of the importance of frequent follow up visits to maximize her success with intensive lifestyle modifications for her multiple health conditions.  ALLERGIES: Allergies  Allergen Reactions  . Adhesive [Tape] Itching and Rash    MEDICATIONS: Current Outpatient Medications on File Prior to Visit  Medication Sig Dispense Refill  . acetaminophen (TYLENOL) 325 MG tablet Take 325 mg by mouth as needed.    . divalproex (DEPAKOTE ER) 500 MG 24 hr tablet Take 1,000 mg  by mouth at bedtime.     . fluvoxaMINE (LUVOX) 100 MG tablet Take 100 mg by mouth 2 (two) times daily.     [name] gabapentin (NEURONTIN) 100 MG capsule Take 100-200 mg by mouth 2 (two) times daily. Take 100 mg every morning  Take 200 mg every night    . levocetirizine (XYZAL) 5 MG tablet Take 5 mg  by mouth every evening.    [name] levothyroxine (SYNTHROID, LEVOTHROID) 50 MCG tablet Take 50 mcg by mouth daily before breakfast.     . montelukast (SINGULAIR) 10 MG tablet Take 10 mg by mouth at bedtime.    . ondansetron (ZOFRAN ODT) 4 MG disintegrating tablet Take 1 tablet (4 mg total) by mouth every 8 (eight) hours as needed for nausea or vomiting. (Patient not taking: Reported on 12/31/2018) 10 tablet 0  . oxyCODONE (OXY IR/ROXICODONE) 5 MG immediate release tablet Take 5 mg by mouth every 6 (six) hours as needed for moderate pain.     . promethazine (PHENERGAN) 25 MG tablet Take 25 mg by mouth every 6 (six) hours as needed for nausea or vomiting.    [name] QUEtiapine (SEROQUEL) 100 MG tablet Take 200 mg by mouth at bedtime.     . sucralfate (CARAFATE) 1 g tablet Take 1 g by mouth 4 (four) times daily -  with meals and at bedtime.    . topiramate (TOPAMAX) 50 MG tablet Take 1 tablet (50 mg total) by mouth daily. 30 tablet 0  . VENTOLIN HFA 108 (90 Base) MCG/ACT inhaler   0  . Vitamin D, Ergocalciferol, (DRISDOL) 1.25 MG (50000 UT) CAPS capsule Take 1 capsule (50,000 Units total) by mouth every 7 (seven) days. 4 capsule 0   No current facility-administered medications on file prior to visit.     PAST MEDICAL HISTORY: Past Medical History:  Diagnosis Date  . Anemia   . Anxiety   . Back pain   . Bipolar 1 disorder ([place])   . Cancer ([place])   . Depression   . Edema, lower extremity   . Gallbladder problem   . GERD (gastroesophageal reflux disease)   . 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 History of blood transfusion   . Hypothyroidism   . Sleep apnea   . Umbilical hernia     PAST SURGICAL HISTORY: Past Surgical History:  Procedure Laterality Date  . 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 BREAST CYST EXCISION Right   . 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 gall stones    . HERNIA REPAIR  07/2011   uh with PVP, complicated by infection, mesh removal and primary repair of hernia   . JOINT REPLACEMENT     bilateral knee  . latareces    . MELANOMA EXCISION  may 2012   cancerous cells removed from  left side of nose   . thumb surgery  1977  . TUBAL LIGATION      SOCIAL HISTORY: Social History   Tobacco Use  . Smoking status: Never Smoker  . Smokeless tobacco: Never Used  Substance Use Topics  . Alcohol use: No  . Drug use: No    FAMILY HISTORY: Family History  Problem Relation Age of Onset  . Heart disease Mother        arrythmia  . Cancer Mother   . Anxiety disorder Mother   . Cancer Father        leukemia  . Depression Father   .  Diabetes Father   . High blood pressure Father     ROS: Review of Systems  Constitutional: Positive for weight loss.    PHYSICAL EXAM: Pt in no acute distress  RECENT LABS AND TESTS: BMET    Component Value Date/Time   NA 143 12/31/2018 1410   K 4.0 12/31/2018 1410   CL 98 12/31/2018 1410   CO2 27 12/31/2018 1410   GLUCOSE 121 (H) 12/31/2018 1410   GLUCOSE 133 (H) 03/01/2016 1450   BUN 11 12/31/2018 1410   CREATININE 0.65 12/31/2018 1410   CREATININE 0.72 08/21/2011 1723   CALCIUM 9.7 12/31/2018 1410   GFRNONAA 91 12/31/2018 1410   GFRAA 105 12/31/2018 1410   Lab Results  Component Value Date   HGBA1C 5.6 12/31/2018   Lab Results  Component Value Date   INSULIN 12.7 12/31/2018   CBC    Component Value Date/Time   WBC 6.1 03/01/2016 1450   RBC 4.69 03/01/2016 1450   HGB 14.3 03/01/2016 1450   HCT 42.9 03/01/2016 1450   PLT 206 03/01/2016 1450   MCV 91.5 03/01/2016 1450   MCH 30.5 03/01/2016 1450   MCHC 33.3 03/01/2016 1450   RDW 14.0 03/01/2016 1450   LYMPHSABS 2.3 08/21/2011 1723   MONOABS 0.5 08/21/2011 1723   EOSABS 0.2 08/21/2011 1723   BASOSABS 0.1 08/21/2011 1723   Iron/TIBC/Ferritin/ %Sat No results found for: IRON, TIBC, FERRITIN, IRONPCTSAT Lipid Panel  No results found for: CHOL, TRIG, HDL, CHOLHDL, VLDL, LDLCALC, LDLDIRECT Hepatic Function Panel     Component Value  Date/Time   PROT 6.7 12/31/2018 1410   ALBUMIN 4.3 12/31/2018 1410   AST 18 12/31/2018 1410   ALT 25 12/31/2018 1410   ALKPHOS 86 12/31/2018 1410   BILITOT 0.5 12/31/2018 1410   No results found for: TSH    Ref. Range 12/31/2018 14:10  Vitamin D, 25-Hydroxy Latest Ref Range: 30.0 - 100.0 ng/mL 10.8 (L)    I, Doreene Nest, am acting as Location manager for [employer]. Owens Shark, DO  I have reviewed the above documentation for accuracy and completeness, and I agree with the above. -Jearld Lesch, DO

## 2019-03-12 ENCOUNTER — Encounter (INDEPENDENT_AMBULATORY_CARE_PROVIDER_SITE_OTHER): Payer: Self-pay | Admitting: Bariatrics

## 2019-03-12 ENCOUNTER — Ambulatory Visit (INDEPENDENT_AMBULATORY_CARE_PROVIDER_SITE_OTHER): Payer: Medicare Other | Admitting: Bariatrics

## 2019-03-12 ENCOUNTER — Other Ambulatory Visit: Payer: Self-pay

## 2019-03-12 DIAGNOSIS — F5089 Other specified eating disorder: Secondary | ICD-10-CM | POA: Diagnosis not present

## 2019-03-12 DIAGNOSIS — E559 Vitamin D deficiency, unspecified: Secondary | ICD-10-CM

## 2019-03-12 DIAGNOSIS — Z6841 Body Mass Index (BMI) 40.0 and over, adult: Secondary | ICD-10-CM

## 2019-03-12 MED ORDER — VITAMIN D (ERGOCALCIFEROL) 1.25 MG (50000 UNIT) PO CAPS: 50000.0000 [IU] | ORAL_CAPSULE | ORAL | 0 refills | Status: DC

## 2019-03-12 NOTE — Progress Notes (Signed)
Office: [phone]  /  Fax: [phone] TeleHealth Visit:  [name] has verbally consented to this TeleHealth visit today. The patient is located at home, the provider is located at the [employer] [facility] office. The participants in this visit include the listed provider and patient and any and all parties involved. The visit was conducted today via FaceTime.  HPI:   Chief Complaint: OBESITY Yolanda Chavez is here to discuss her progress with her obesity treatment plan. She is on the Category 3 plan and is following her eating plan approximately 50 % of the time. She states she is walking 10 minutes 3 times per week. [name] states that she has lost 4 pounds (weight 282 lbs). She is drinking adequate water. We were unable to weigh the patient today for this TeleHealth visit. She feels as if she has lost weight since her last visit. She has lost 12 lbs since starting treatment with [name].  Vitamin D deficiency [name] has a diagnosis of vitamin D deficiency. She is currently taking vit D and denies nausea, vomiting or muscle weakness.  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 She is taking Topamax without any side effects. [name] has decreased snacking.  ASSESSMENT AND PLAN:  Vitamin D deficiency - Plan: Vitamin D, Ergocalciferol, (DRISDOL) 1.25 MG (50000 UT) CAPS capsule  Other disorder of eating  Class 3 severe obesity with serious comorbidity and body mass index (BMI) of 50.0 to 59.9 in adult, unspecified obesity type ([place])  PLAN:  Vitamin D Deficiency [name] was informed that low vitamin D levels contributes to fatigue and are associated with obesity, breast, and colon cancer. She agrees to continue to take prescription Vit D @50 ,000 IU every week #4 with no refills and will follow up for routine testing of vitamin D, at least 2-3 times per year. She was informed of the risk of over-replacement of vitamin D and agrees to not  increase her dose unless she discusses this with [name] first. [name] agrees to follow up as directed.  Night Time Eating Disorder [name] will continue Topamax. We talked about "acceptable snacks" today. [name] will follow up with our clinic in 2 weeks.  Obesity Tanicia is currently in the action stage of change. As such, her goal is to continue with weight loss efforts She has agreed to follow the Category 3 plan [name] will continue her exercise regimen for weight loss and overall health benefits. We discussed the following Behavioral Modification Strategies today: increase H2O intake, no skipping meals, keeping healthy foods in the home, increasing lean protein intake, decreasing simple carbohydrates, increasing vegetables, decrease eating out and work on meal planning and intentional eating  [name] has agreed to follow up with our clinic in 2 weeks. She was informed of the importance of frequent follow up visits to maximize her success with intensive lifestyle modifications for her multiple health conditions.  ALLERGIES: Allergies  Allergen Reactions  . Adhesive [Tape] Itching and Rash    MEDICATIONS: Current Outpatient Medications on File Prior to Visit  Medication Sig Dispense Refill  . acetaminophen (TYLENOL) 325 MG tablet Take 325 mg by mouth as needed.    . divalproex (DEPAKOTE ER) 500 MG 24 hr tablet Take 1,000 mg by mouth at bedtime.     . fluvoxaMINE (LUVOX) 100 MG tablet Take 100 mg by mouth 2 (two) times daily.     [name] gabapentin (NEURONTIN) 100 MG capsule Take 100-200 mg by mouth 2 (two) times daily.  Take 100 mg every morning  Take 200 mg every night    . levocetirizine (XYZAL) 5 MG tablet Take 5 mg by mouth every evening.    [name] levothyroxine (SYNTHROID, LEVOTHROID) 50 MCG tablet Take 50 mcg by mouth daily before breakfast.     . montelukast (SINGULAIR) 10 MG tablet Take 10 mg by mouth at bedtime.    . ondansetron (ZOFRAN ODT) 4 MG disintegrating tablet Take 1 tablet (4 mg  total) by mouth every 8 (eight) hours as needed for nausea or vomiting. (Patient not taking: Reported on 12/31/2018) 10 tablet 0  . oxyCODONE (OXY IR/ROXICODONE) 5 MG immediate release tablet Take 5 mg by mouth every 6 (six) hours as needed for moderate pain.     . promethazine (PHENERGAN) 25 MG tablet Take 25 mg by mouth every 6 (six) hours as needed for nausea or vomiting.    [name] QUEtiapine (SEROQUEL) 100 MG tablet Take 200 mg by mouth at bedtime.     . sucralfate (CARAFATE) 1 g tablet Take 1 g by mouth 4 (four) times daily -  with meals and at bedtime.    . topiramate (TOPAMAX) 50 MG tablet Take 1 tablet (50 mg total) by mouth daily. 30 tablet 0  . VENTOLIN HFA 108 (90 Base) MCG/ACT inhaler   0   No current facility-administered medications on file prior to visit.     PAST MEDICAL HISTORY: Past Medical History:  Diagnosis Date  . Anemia   . Anxiety   . Back pain   . Bipolar 1 disorder ([place])   . Cancer ([place])   . Depression   . Edema, lower extremity   . Gallbladder problem   . GERD (gastroesophageal reflux disease)   . 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 History of blood transfusion   . Hypothyroidism   . Sleep apnea   . Umbilical hernia     PAST SURGICAL HISTORY: Past Surgical History:  Procedure Laterality Date  . 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 BREAST CYST EXCISION Right   . 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 gall stones    . HERNIA REPAIR  07/2011   uh with PVP, complicated by infection, mesh removal and primary repair of hernia  . JOINT REPLACEMENT     bilateral knee  . latareces    . MELANOMA EXCISION  may 2012   cancerous cells removed from  left side of nose   . thumb surgery  1977  . TUBAL LIGATION      SOCIAL HISTORY: Social History   Tobacco Use  . Smoking status: Never Smoker  . Smokeless tobacco: Never Used  Substance Use Topics  . Alcohol use: No  . Drug use: No    FAMILY HISTORY: Family History  Problem Relation Age of  Onset  . Heart disease Mother        arrythmia  . Cancer Mother   . Anxiety disorder Mother   . Cancer Father        leukemia  . Depression Father   . Diabetes Father   . High blood pressure Father     ROS: Review of Systems  Constitutional: Positive for weight loss.  Gastrointestinal: Negative for nausea and vomiting.  Musculoskeletal:       Negative for muscle weakness    PHYSICAL EXAM: Pt in no acute distress  RECENT LABS AND TESTS: BMET    Component Value Date/Time   NA  143 12/31/2018 1410   K 4.0 12/31/2018 1410   CL 98 12/31/2018 1410   CO2 27 12/31/2018 1410   GLUCOSE 121 (H) 12/31/2018 1410   GLUCOSE 133 (H) 03/01/2016 1450   BUN 11 12/31/2018 1410   CREATININE 0.65 12/31/2018 1410   CREATININE 0.72 08/21/2011 1723   CALCIUM 9.7 12/31/2018 1410   GFRNONAA 91 12/31/2018 1410   GFRAA 105 12/31/2018 1410   Lab Results  Component Value Date   HGBA1C 5.6 12/31/2018   Lab Results  Component Value Date   INSULIN 12.7 12/31/2018   CBC    Component Value Date/Time   WBC 6.1 03/01/2016 1450   RBC 4.69 03/01/2016 1450   HGB 14.3 03/01/2016 1450   HCT 42.9 03/01/2016 1450   PLT 206 03/01/2016 1450   MCV 91.5 03/01/2016 1450   MCH 30.5 03/01/2016 1450   MCHC 33.3 03/01/2016 1450   RDW 14.0 03/01/2016 1450   LYMPHSABS 2.3 08/21/2011 1723   MONOABS 0.5 08/21/2011 1723   EOSABS 0.2 08/21/2011 1723   BASOSABS 0.1 08/21/2011 1723   Iron/TIBC/Ferritin/ %Sat No results found for: IRON, TIBC, FERRITIN, IRONPCTSAT Lipid Panel  No results found for: CHOL, TRIG, HDL, CHOLHDL, VLDL, LDLCALC, LDLDIRECT Hepatic Function Panel     Component Value Date/Time   PROT 6.7 12/31/2018 1410   ALBUMIN 4.3 12/31/2018 1410   AST 18 12/31/2018 1410   ALT 25 12/31/2018 1410   ALKPHOS 86 12/31/2018 1410   BILITOT 0.5 12/31/2018 1410   No results found for: TSH  Results for RAYLIE, MADDISON (MRN [id number]) as of 03/12/2019 14:39  Ref. Range 12/31/2018 14:10   Vitamin D, 25-Hydroxy Latest Ref Range: 30.0 - 100.0 ng/mL 10.8 (L)    I, Doreene Nest, am acting as Location manager for [employer]. Owens Shark, DO  I have reviewed the above documentation for accuracy and completeness, and I agree with the above. -Jearld Lesch, DO

## 2019-03-17 ENCOUNTER — Other Ambulatory Visit (INDEPENDENT_AMBULATORY_CARE_PROVIDER_SITE_OTHER): Payer: Self-pay

## 2019-03-17 ENCOUNTER — Encounter (INDEPENDENT_AMBULATORY_CARE_PROVIDER_SITE_OTHER): Payer: Self-pay | Admitting: Bariatrics

## 2019-03-17 ENCOUNTER — Other Ambulatory Visit (INDEPENDENT_AMBULATORY_CARE_PROVIDER_SITE_OTHER): Payer: Self-pay | Admitting: Bariatrics

## 2019-03-17 DIAGNOSIS — F5089 Other specified eating disorder: Secondary | ICD-10-CM

## 2019-03-17 MED ORDER — TOPIRAMATE 50 MG PO TABS: 50.0000 mg | ORAL_TABLET | Freq: Every day | ORAL | 0 refills | Status: DC

## 2019-03-25 ENCOUNTER — Other Ambulatory Visit: Payer: Self-pay

## 2019-03-25 ENCOUNTER — Encounter (INDEPENDENT_AMBULATORY_CARE_PROVIDER_SITE_OTHER): Payer: Self-pay | Admitting: Bariatrics

## 2019-03-25 ENCOUNTER — Ambulatory Visit (INDEPENDENT_AMBULATORY_CARE_PROVIDER_SITE_OTHER): Payer: Medicare Other | Admitting: Bariatrics

## 2019-03-25 DIAGNOSIS — E8881 Metabolic syndrome: Secondary | ICD-10-CM

## 2019-03-25 DIAGNOSIS — Z6841 Body Mass Index (BMI) 40.0 and over, adult: Secondary | ICD-10-CM

## 2019-03-25 DIAGNOSIS — E559 Vitamin D deficiency, unspecified: Secondary | ICD-10-CM

## 2019-03-25 DIAGNOSIS — F5089 Other specified eating disorder: Secondary | ICD-10-CM

## 2019-03-26 NOTE — Progress Notes (Signed)
Office: [phone]  /  Fax: [phone] TeleHealth Visit:  Yolanda Chavez has verbally consented to this TeleHealth visit today. The patient is located at home, the provider is located at the [employer] [facility] office. The participants in this visit include the listed provider and patient and any and all parties involved. The visit was conducted today via FaceTime.  HPI:   Chief Complaint: OBESITY Yolanda Chavez is here to discuss her progress with her obesity treatment plan. She is on the Category 3 plan and is following her eating plan approximately 40 % of the time. She states she is exercising 0 minutes 0 times per week. [name] states that her weight has been fluctuating from 284 to 281 pounds and back. She was out of the Topamax for four days, and her dog tore their ACL. We were unable to weigh the patient today for this TeleHealth visit. She feels as if she has gained and lost weight since her last visit. She has lost 10 to 13 lbs since starting treatment with [name].  Vitamin D deficiency Yolanda Chavez has a diagnosis of vitamin D deficiency. Her last vitamin D level was at 10.8 She is currently taking high vit D and denies nausea, vomiting or muscle weakness.  Insulin Resistance Yolanda Chavez has a diagnosis of insulin resistance based on her elevated fasting insulin level >5. Although Yolanda Chavez's blood glucose readings are still under good control, insulin resistance puts her at greater risk of metabolic syndrome and diabetes. Her last A1c was at 5.6 and last insulin level was at 12.7 Yolanda Chavez is not on medications and she continues to work on diet and exercise to decrease risk of diabetes.  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  ASSESSMENT AND PLAN:  Vitamin D deficiency  Insulin resistance  Other disorder of eating - nighttime  Class 3 severe obesity with serious comorbidity and body mass index (BMI) of 50.0 to  59.9 in adult, unspecified obesity type ([place])  PLAN:  Vitamin D Deficiency Yolanda Chavez was informed that low vitamin D levels contributes to fatigue and are associated with obesity, breast, and colon cancer. She will continue to take prescription Vit D @50 ,000 IU every week and will follow up for routine testing of vitamin D, at least 2-3 times per year. She was informed of the risk of over-replacement of vitamin D and agrees to not increase her dose unless she discusses this with [name] first.  Insulin Resistance Yolanda Chavez will continue to work on weight loss, exercise, increasing lean protein and decreasing simple carbohydrates in her diet to help decrease the risk of diabetes. She was informed that eating too many simple carbohydrates or too many calories at one sitting increases the likelihood of GI side effects. Yolanda Chavez agreed to follow up with [name] as directed to monitor her progress.  Nighttime Eating Disorder Yolanda Chavez will continue to take Topamax. She will follow up with our clinic in 2 weeks.  Obesity Yolanda Chavez is currently in the action stage of change. As such, her goal is to continue with weight loss efforts She has agreed to follow the Category 3 plan Yolanda Chavez will do YouTube chair exercises (10-15) and low volume aerobics for weight loss and overall health benefits. We discussed the following Behavioral Modification Strategies today: planning for success, increase H2O intake, no skipping meals, keeping healthy foods in the home, increasing lean protein intake, decreasing simple carbohydrates, increasing vegetables, decrease eating out and work on meal planning and  intentional eating Yolanda Chavez will weigh herself at home and record.  Yolanda Chavez has agreed to follow up with our clinic in 2 weeks. She was informed of the importance of frequent follow up visits to maximize her success with intensive lifestyle modifications for her multiple health conditions.  ALLERGIES: Allergies  Allergen Reactions  .  Adhesive [Tape] Itching and Rash    MEDICATIONS: Current Outpatient Medications on File Prior to Visit  Medication Sig Dispense Refill  . acetaminophen (TYLENOL) 325 MG tablet Take 325 mg by mouth as needed.    . divalproex (DEPAKOTE ER) 500 MG 24 hr tablet Take 1,000 mg by mouth at bedtime.     . fluvoxaMINE (LUVOX) 100 MG tablet Take 100 mg by mouth 2 (two) times daily.     [name] gabapentin (NEURONTIN) 100 MG capsule Take 100-200 mg by mouth 2 (two) times daily. Take 100 mg every morning  Take 200 mg every night    . levocetirizine (XYZAL) 5 MG tablet Take 5 mg by mouth every evening.    [name] levothyroxine (SYNTHROID, LEVOTHROID) 50 MCG tablet Take 50 mcg by mouth daily before breakfast.     . montelukast (SINGULAIR) 10 MG tablet Take 10 mg by mouth at bedtime.    . ondansetron (ZOFRAN ODT) 4 MG disintegrating tablet Take 1 tablet (4 mg total) by mouth every 8 (eight) hours as needed for nausea or vomiting. 10 tablet 0  . oxyCODONE (OXY IR/ROXICODONE) 5 MG immediate release tablet Take 5 mg by mouth every 6 (six) hours as needed for moderate pain.     . promethazine (PHENERGAN) 25 MG tablet Take 25 mg by mouth every 6 (six) hours as needed for nausea or vomiting.    [name] QUEtiapine (SEROQUEL) 100 MG tablet Take 200 mg by mouth at bedtime.     . sucralfate (CARAFATE) 1 g tablet Take 1 g by mouth 4 (four) times daily -  with meals and at bedtime.    . topiramate (TOPAMAX) 50 MG tablet Take 1 tablet (50 mg total) by mouth daily. 30 tablet 0  . VENTOLIN HFA 108 (90 Base) MCG/ACT inhaler   0  . Vitamin D, Ergocalciferol, (DRISDOL) 1.25 MG (50000 UT) CAPS capsule Take 1 capsule (50,000 Units total) by mouth every 7 (seven) days. 4 capsule 0   No current facility-administered medications on file prior to visit.     PAST MEDICAL HISTORY: Past Medical History:  Diagnosis Date  . Anemia   . Anxiety   . Back pain   . Bipolar 1 disorder ([place])   . Cancer ([place])   . Depression   . Edema, lower extremity    . Gallbladder problem   . GERD (gastroesophageal reflux disease)   . 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 History of blood transfusion   . Hypothyroidism   . Sleep apnea   . Umbilical hernia     PAST SURGICAL HISTORY: Past Surgical History:  Procedure Laterality Date  . 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 BREAST CYST EXCISION Right   . 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 gall stones    . HERNIA REPAIR  07/2011   uh with PVP, complicated by infection, mesh removal and primary repair of hernia  . JOINT REPLACEMENT     bilateral knee  . latareces    . MELANOMA EXCISION  may 2012   cancerous cells removed from  left side of nose   . thumb  surgery  1977  . TUBAL LIGATION      SOCIAL HISTORY: Social History   Tobacco Use  . Smoking status: Never Smoker  . Smokeless tobacco: Never Used  Substance Use Topics  . Alcohol use: No  . Drug use: No    FAMILY HISTORY: Family History  Problem Relation Age of Onset  . Heart disease Mother        arrythmia  . Cancer Mother   . Anxiety disorder Mother   . Cancer Father        leukemia  . Depression Father   . Diabetes Father   . High blood pressure Father     ROS: Review of Systems  Gastrointestinal: Negative for nausea and vomiting.  Musculoskeletal:       Negative for muscle weakness    PHYSICAL EXAM: Pt in no acute distress  RECENT LABS AND TESTS: BMET    Component Value Date/Time   NA 143 12/31/2018 1410   K 4.0 12/31/2018 1410   CL 98 12/31/2018 1410   CO2 27 12/31/2018 1410   GLUCOSE 121 (H) 12/31/2018 1410   GLUCOSE 133 (H) 03/01/2016 1450   BUN 11 12/31/2018 1410   CREATININE 0.65 12/31/2018 1410   CREATININE 0.72 08/21/2011 1723   CALCIUM 9.7 12/31/2018 1410   GFRNONAA 91 12/31/2018 1410   GFRAA 105 12/31/2018 1410   Lab Results  Component Value Date   HGBA1C 5.6 12/31/2018   Lab Results  Component Value Date   INSULIN 12.7 12/31/2018   CBC    Component Value Date/Time    WBC 6.1 03/01/2016 1450   RBC 4.69 03/01/2016 1450   HGB 14.3 03/01/2016 1450   HCT 42.9 03/01/2016 1450   PLT 206 03/01/2016 1450   MCV 91.5 03/01/2016 1450   MCH 30.5 03/01/2016 1450   MCHC 33.3 03/01/2016 1450   RDW 14.0 03/01/2016 1450   LYMPHSABS 2.3 08/21/2011 1723   MONOABS 0.5 08/21/2011 1723   EOSABS 0.2 08/21/2011 1723   BASOSABS 0.1 08/21/2011 1723   Iron/TIBC/Ferritin/ %Sat No results found for: IRON, TIBC, FERRITIN, IRONPCTSAT Lipid Panel  No results found for: CHOL, TRIG, HDL, CHOLHDL, VLDL, LDLCALC, LDLDIRECT Hepatic Function Panel     Component Value Date/Time   PROT 6.7 12/31/2018 1410   ALBUMIN 4.3 12/31/2018 1410   AST 18 12/31/2018 1410   ALT 25 12/31/2018 1410   ALKPHOS 86 12/31/2018 1410   BILITOT 0.5 12/31/2018 1410   No results found for: TSH    Ref. Range 12/31/2018 14:10  Vitamin D, 25-Hydroxy Latest Ref Range: 30.0 - 100.0 ng/mL 10.8 (L)    I, Doreene Nest, am acting as Location manager for [employer]. Owens Shark, DO  I have reviewed the above documentation for accuracy and completeness, and I agree with the above. -Jearld Lesch, DO

## 2019-04-03 ENCOUNTER — Other Ambulatory Visit (INDEPENDENT_AMBULATORY_CARE_PROVIDER_SITE_OTHER): Payer: Self-pay | Admitting: Bariatrics

## 2019-04-03 DIAGNOSIS — E559 Vitamin D deficiency, unspecified: Secondary | ICD-10-CM

## 2019-04-07 ENCOUNTER — Ambulatory Visit (INDEPENDENT_AMBULATORY_CARE_PROVIDER_SITE_OTHER): Payer: Medicare Other | Admitting: Bariatrics

## 2019-04-07 ENCOUNTER — Other Ambulatory Visit: Payer: Self-pay

## 2019-04-07 ENCOUNTER — Encounter (INDEPENDENT_AMBULATORY_CARE_PROVIDER_SITE_OTHER): Payer: Self-pay | Admitting: Bariatrics

## 2019-04-07 DIAGNOSIS — E559 Vitamin D deficiency, unspecified: Secondary | ICD-10-CM

## 2019-04-07 DIAGNOSIS — F3289 Other specified depressive episodes: Secondary | ICD-10-CM

## 2019-04-07 DIAGNOSIS — Z6841 Body Mass Index (BMI) 40.0 and over, adult: Secondary | ICD-10-CM

## 2019-04-07 MED ORDER — TOPIRAMATE 50 MG PO TABS: 50.0000 mg | ORAL_TABLET | Freq: Every day | ORAL | 0 refills | Status: DC

## 2019-04-07 MED ORDER — VITAMIN D (ERGOCALCIFEROL) 1.25 MG (50000 UNIT) PO CAPS: 50000.0000 [IU] | ORAL_CAPSULE | ORAL | 0 refills | Status: DC

## 2019-04-08 NOTE — Progress Notes (Signed)
Office: [phone]  /  Fax: [phone] TeleHealth Visit:  Yolanda Chavez has verbally consented to this TeleHealth visit today. The patient is located at home, the provider is located at the [employer] [facility] office. The participants in this visit include the listed provider and patient and any and all parties involved. The visit was conducted today via FaceTime.  HPI:   Chief Complaint: OBESITY Yolanda Chavez is here to discuss her progress with her obesity treatment plan. She is on the Category 3 plan and is following her eating plan approximately 60 % of the time. She states she is doing pool aerobics 2 hours 3 to 4 times per week. Yolanda Chavez states that she has lost 1 pound (weight 280 lbs). She denies any struggles and she is doing well with water. We were unable to weigh the patient today for this TeleHealth visit. She feels as if she has lost weight since her last visit. She has lost 14 lbs since starting treatment with [name].  Vitamin D deficiency Yolanda Chavez has a diagnosis of vitamin D deficiency. She is currently taking vit D and denies nausea, vomiting or muscle weakness.  Depression with emotional eating behaviors Yolanda Chavez struggles with emotional eating and using food for comfort to the extent that it is negatively impacting her health. She often snacks when she is not hungry. Yolanda Chavez sometimes feels she is out of control and then feels guilty that she made poor food choices. Her snacking has improved. She has been working on behavior modification techniques to help reduce her emotional eating and has been somewhat successful. She shows no sign of suicidal or homicidal ideations.  ASSESSMENT AND PLAN:  Vitamin D deficiency - Plan: Vitamin D, Ergocalciferol, (DRISDOL) 1.25 MG (50000 UT) CAPS capsule  Other depression - Plan: topiramate (TOPAMAX) 50 MG tablet  Class 3 severe obesity with serious comorbidity and body mass index (BMI) of 50.0 to 59.9 in adult, unspecified obesity  type ([place])  PLAN:  Vitamin D Deficiency Yolanda Chavez was informed that low vitamin D levels contributes to fatigue and are associated with obesity, breast, and colon cancer. She agrees to continue to take prescription Vit D @50 ,000 IU every week #4 with no refills and will follow up for routine testing of vitamin D, at least 2-3 times per year. She was informed of the risk of over-replacement of vitamin D and agrees to not increase her dose unless she discusses this with [name] first. Yolanda Chavez agrees to follow up as directed.  Depression with Emotional Eating Behaviors We discussed behavior modification techniques today to help Yolanda Chavez deal with her emotional eating and depression. She has agreed to continue Topamax 50 mg once daily #30 with no refills and follow up as directed.  Obesity Yolanda Chavez is currently in the action stage of change. As such, her goal is to continue with weight loss efforts She has agreed to follow the Category 3 plan Yolanda Chavez will continue her current exercise regimen (pool aerobics) for weight loss and overall health benefits. We discussed the following Behavioral Modification Strategies today: planning for success, increase H2O intake, no skipping meals, keeping healthy foods in the home, increasing lean protein intake, decreasing simple carbohydrates, increasing vegetables, decrease eating out and work on meal planning and easy cooking plans Yolanda Chavez will weigh herself at home until she returns to the office. She will continue to weigh her meats.  Yolanda Chavez has agreed to follow up with our clinic in 2 weeks. She was informed of the importance of frequent follow up  visits to maximize her success with intensive lifestyle modifications for her multiple health conditions.  ALLERGIES: Allergies  Allergen Reactions  . Adhesive [Tape] Itching and Rash    MEDICATIONS: Current Outpatient Medications on File Prior to Visit  Medication Sig Dispense Refill  . acetaminophen (TYLENOL) 325 MG  tablet Take 325 mg by mouth as needed.    . divalproex (DEPAKOTE ER) 500 MG 24 hr tablet Take 1,000 mg by mouth at bedtime.     . fluvoxaMINE (LUVOX) 100 MG tablet Take 100 mg by mouth 2 (two) times daily.     [name] gabapentin (NEURONTIN) 100 MG capsule Take 100-200 mg by mouth 2 (two) times daily. Take 100 mg every morning  Take 200 mg every night    . levocetirizine (XYZAL) 5 MG tablet Take 5 mg by mouth every evening.    [name] levothyroxine (SYNTHROID, LEVOTHROID) 50 MCG tablet Take 50 mcg by mouth daily before breakfast.     . montelukast (SINGULAIR) 10 MG tablet Take 10 mg by mouth at bedtime.    . ondansetron (ZOFRAN ODT) 4 MG disintegrating tablet Take 1 tablet (4 mg total) by mouth every 8 (eight) hours as needed for nausea or vomiting. 10 tablet 0  . oxyCODONE (OXY IR/ROXICODONE) 5 MG immediate release tablet Take 5 mg by mouth every 6 (six) hours as needed for moderate pain.     . promethazine (PHENERGAN) 25 MG tablet Take 25 mg by mouth every 6 (six) hours as needed for nausea or vomiting.    [name] QUEtiapine (SEROQUEL) 100 MG tablet Take 200 mg by mouth at bedtime.     . sucralfate (CARAFATE) 1 g tablet Take 1 g by mouth 4 (four) times daily -  with meals and at bedtime.    . VENTOLIN HFA 108 (90 Base) MCG/ACT inhaler   0   No current facility-administered medications on file prior to visit.     PAST MEDICAL HISTORY: Past Medical History:  Diagnosis Date  . Anemia   . Anxiety   . Back pain   . Bipolar 1 disorder ([place])   . Cancer ([place])   . Depression   . Edema, lower extremity   . Gallbladder problem   . GERD (gastroesophageal reflux disease)   . 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 History of blood transfusion   . Hypothyroidism   . Sleep apnea   . Umbilical hernia     PAST SURGICAL HISTORY: Past Surgical History:  Procedure Laterality Date  . 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 BREAST CYST EXCISION Right   . 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 gall stones     . HERNIA REPAIR  07/2011   uh with PVP, complicated by infection, mesh removal and primary repair of hernia  . JOINT REPLACEMENT     bilateral knee  . latareces    . MELANOMA EXCISION  may 2012   cancerous cells removed from  left side of nose   . thumb surgery  1977  . TUBAL LIGATION      SOCIAL HISTORY: Social History   Tobacco Use  . Smoking status: Never Smoker  . Smokeless tobacco: Never Used  Substance Use Topics  . Alcohol use: No  . Drug use: No    FAMILY HISTORY: Family History  Problem Relation Age of Onset  . Heart disease Mother        arrythmia  . Cancer Mother   .  Anxiety disorder Mother   . Cancer Father        leukemia  . Depression Father   . Diabetes Father   . High blood pressure Father     ROS: Review of Systems  Constitutional: Positive for weight loss.  Gastrointestinal: Negative for nausea and vomiting.  Musculoskeletal:       Negative for muscle weakness  Psychiatric/Behavioral: Positive for depression. Negative for suicidal ideas.    PHYSICAL EXAM: Pt in no acute distress  RECENT LABS AND TESTS: BMET    Component Value Date/Time   NA 143 12/31/2018 1410   K 4.0 12/31/2018 1410   CL 98 12/31/2018 1410   CO2 27 12/31/2018 1410   GLUCOSE 121 (H) 12/31/2018 1410   GLUCOSE 133 (H) 03/01/2016 1450   BUN 11 12/31/2018 1410   CREATININE 0.65 12/31/2018 1410   CREATININE 0.72 08/21/2011 1723   CALCIUM 9.7 12/31/2018 1410   GFRNONAA 91 12/31/2018 1410   GFRAA 105 12/31/2018 1410   Lab Results  Component Value Date   HGBA1C 5.6 12/31/2018   Lab Results  Component Value Date   INSULIN 12.7 12/31/2018   CBC    Component Value Date/Time   WBC 6.1 03/01/2016 1450   RBC 4.69 03/01/2016 1450   HGB 14.3 03/01/2016 1450   HCT 42.9 03/01/2016 1450   PLT 206 03/01/2016 1450   MCV 91.5 03/01/2016 1450   MCH 30.5 03/01/2016 1450   MCHC 33.3 03/01/2016 1450   RDW 14.0 03/01/2016 1450   LYMPHSABS 2.3 08/21/2011 1723   MONOABS 0.5  08/21/2011 1723   EOSABS 0.2 08/21/2011 1723   BASOSABS 0.1 08/21/2011 1723   Iron/TIBC/Ferritin/ %Sat No results found for: IRON, TIBC, FERRITIN, IRONPCTSAT Lipid Panel  No results found for: CHOL, TRIG, HDL, CHOLHDL, VLDL, LDLCALC, LDLDIRECT Hepatic Function Panel     Component Value Date/Time   PROT 6.7 12/31/2018 1410   ALBUMIN 4.3 12/31/2018 1410   AST 18 12/31/2018 1410   ALT 25 12/31/2018 1410   ALKPHOS 86 12/31/2018 1410   BILITOT 0.5 12/31/2018 1410   No results found for: TSH   Ref. Range 12/31/2018 14:10  Vitamin D, 25-Hydroxy Latest Ref Range: 30.0 - 100.0 ng/mL 10.8 (L)    I, Doreene Nest, am acting as Location manager for [employer]. Owens Shark, DO  I have reviewed the above documentation for accuracy and completeness, and I agree with the above. -Jearld Lesch, DO

## 2019-04-21 ENCOUNTER — Other Ambulatory Visit: Payer: Self-pay

## 2019-04-21 ENCOUNTER — Encounter (INDEPENDENT_AMBULATORY_CARE_PROVIDER_SITE_OTHER): Payer: Self-pay | Admitting: Bariatrics

## 2019-04-21 ENCOUNTER — Telehealth (INDEPENDENT_AMBULATORY_CARE_PROVIDER_SITE_OTHER): Payer: Medicare Other | Admitting: Bariatrics

## 2019-04-21 DIAGNOSIS — E559 Vitamin D deficiency, unspecified: Secondary | ICD-10-CM

## 2019-04-21 DIAGNOSIS — F3289 Other specified depressive episodes: Secondary | ICD-10-CM | POA: Diagnosis not present

## 2019-04-21 DIAGNOSIS — Z6841 Body Mass Index (BMI) 40.0 and over, adult: Secondary | ICD-10-CM

## 2019-04-21 MED ORDER — VITAMIN D (ERGOCALCIFEROL) 1.25 MG (50000 UNIT) PO CAPS: 50000.0000 [IU] | ORAL_CAPSULE | ORAL | 0 refills | Status: DC

## 2019-04-21 NOTE — Progress Notes (Signed)
Office: [phone]  /  Fax: [phone] TeleHealth Visit:  Yolanda Chavez has verbally consented to this TeleHealth visit today. The patient is located at home, the provider is located at the [employer] [facility] office. The participants in this visit include the listed provider and patient. The visit was conducted today via FaceTime.  HPI:   Chief Complaint: OBESITY Yolanda Chavez is here to discuss her progress with her obesity treatment plan. She is on the Category 3 plan and is following her eating plan approximately 50% of the time. She states she is exercising 0 minutes 0 times per week. Yolanda Chavez states that she has lost 1 lb (weight 279) and is doing well. She has had minimal stress eating and her appetite is normal. We were unable to weigh the patient today for this TeleHealth visit. She feels as if she has lost 1 lb since her last visit. She has lost 0 lbs since starting treatment with [name].  Vitamin D deficiency Yolanda Chavez has a diagnosis of Vitamin D deficiency. She is currently taking prescription Vit D and denies nausea, vomiting or muscle weakness.  Depression with emotional eating behaviors Yolanda Chavez is struggling with emotional eating and using food for comfort to the extent that it is negatively impacting her health. She often snacks when she is not hungry. Yolanda Chavez sometimes feels she is out of control and then feels guilty that she made poor food choices. She has been working on behavior modification techniques to help reduce her emotional eating and has been somewhat successful. Yolanda Chavez is on no medication for stress eating. She shows no sign of suicidal or homicidal ideations.  Depression screen [facility] 2/9 12/31/2018  Decreased Interest 3  Down, Depressed, Hopeless 1  PHQ - 2 Score 4  Altered sleeping 0  Tired, decreased energy 3  Change in appetite 2  Feeling bad or failure about yourself  0  Trouble concentrating 3  Moving slowly or fidgety/restless 0  Suicidal thoughts 0   PHQ-9 Score 12  Difficult doing work/chores Not difficult at all   ASSESSMENT AND PLAN:  Other depression  Vitamin D deficiency - Plan: Vitamin D, Ergocalciferol, (DRISDOL) 1.25 MG (50000 UT) CAPS capsule  Class 3 severe obesity with serious comorbidity and body mass index (BMI) of 40.0 to 44.9 in adult, unspecified obesity type ([facility])  PLAN:  Vitamin D Deficiency Yolanda Chavez was informed that low Vitamin D levels contributes to fatigue and are associated with obesity, breast, and colon cancer. She agrees to continue to take prescription Vit D @ 50,000 IU every week #4 with 0 refills and will follow-up for routine testing of Vitamin D, at least 2-3 times per year. She was informed of the risk of over-replacement of Vitamin D and agrees to not increase her dose unless she discusses this with [name] first. Yolanda Chavez agrees to follow-up with our clinic in 2 weeks.  Depression with Emotional Eating Behaviors We discussed behavior modification techniques today to help Yolanda Chavez deal with her emotional eating and depression. Yolanda Chavez will follow-up with her PCP and psychiatrist for her psychiatric medication.  Obesity Yolanda Chavez is currently in the action stage of change. As such, her goal is to continue with weight loss efforts. She has agreed to follow the Category 3 plan. Yolanda Chavez will work on meal planning, intentional eating, and will continue to read labels. Yolanda Chavez has been instructed to resume swimming for weight loss and overall health benefits. We discussed the following Behavioral Modification Strategies today: increasing lean protein intake, decreasing simple carbohydrates,  increasing vegetables, increase H20 intake, decrease eating out, no skipping meals, work on meal planning and easy cooking plans, and keeping healthy foods in the home.  Yolanda Chavez has agreed to follow-up with our clinic in 2 weeks. She was informed of the importance of frequent follow-up visits to maximize her success with intensive  lifestyle modifications for her multiple health conditions.  ALLERGIES: Allergies  Allergen Reactions  . Adhesive [Tape] Itching and Rash    MEDICATIONS: Current Outpatient Medications on File Prior to Visit  Medication Sig Dispense Refill  . acetaminophen (TYLENOL) 325 MG tablet Take 325 mg by mouth as needed.    . divalproex (DEPAKOTE ER) 500 MG 24 hr tablet Take 1,000 mg by mouth at bedtime.     . fluvoxaMINE (LUVOX) 100 MG tablet Take 100 mg by mouth 2 (two) times daily.     [name] gabapentin (NEURONTIN) 100 MG capsule Take 100-200 mg by mouth 2 (two) times daily. Take 100 mg every morning  Take 200 mg every night    . levocetirizine (XYZAL) 5 MG tablet Take 5 mg by mouth every evening.    [name] levothyroxine (SYNTHROID, LEVOTHROID) 50 MCG tablet Take 50 mcg by mouth daily before breakfast.     . montelukast (SINGULAIR) 10 MG tablet Take 10 mg by mouth at bedtime.    . ondansetron (ZOFRAN ODT) 4 MG disintegrating tablet Take 1 tablet (4 mg total) by mouth every 8 (eight) hours as needed for nausea or vomiting. 10 tablet 0  . oxyCODONE (OXY IR/ROXICODONE) 5 MG immediate release tablet Take 5 mg by mouth every 6 (six) hours as needed for moderate pain.     . promethazine (PHENERGAN) 25 MG tablet Take 25 mg by mouth every 6 (six) hours as needed for nausea or vomiting.    [name] QUEtiapine (SEROQUEL) 100 MG tablet Take 200 mg by mouth at bedtime.     . sucralfate (CARAFATE) 1 g tablet Take 1 g by mouth 4 (four) times daily -  with meals and at bedtime.    . topiramate (TOPAMAX) 50 MG tablet Take 1 tablet (50 mg total) by mouth daily. 30 tablet 0  . VENTOLIN HFA 108 (90 Base) MCG/ACT inhaler   0   No current facility-administered medications on file prior to visit.     PAST MEDICAL HISTORY: Past Medical History:  Diagnosis Date  . Anemia   . Anxiety   . Back pain   . Bipolar 1 disorder ([place])   . Cancer ([place])   . Depression   . Edema, lower extremity   . Gallbladder problem   . GERD  (gastroesophageal reflux disease)   . 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 History of blood transfusion   . Hypothyroidism   . Sleep apnea   . Umbilical hernia     PAST SURGICAL HISTORY: Past Surgical History:  Procedure Laterality Date  . 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 BREAST CYST EXCISION Right   . 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 gall stones    . HERNIA REPAIR  07/2011   uh with PVP, complicated by infection, mesh removal and primary repair of hernia  . JOINT REPLACEMENT     bilateral knee  . latareces    . MELANOMA EXCISION  may 2012   cancerous cells removed from  left side of nose   . thumb surgery  1977  . TUBAL LIGATION      SOCIAL  HISTORY: Social History   Tobacco Use  . Smoking status: Never Smoker  . Smokeless tobacco: Never Used  Substance Use Topics  . Alcohol use: No  . Drug use: No    FAMILY HISTORY: Family History  Problem Relation Age of Onset  . Heart disease Mother        arrythmia  . Cancer Mother   . Anxiety disorder Mother   . Cancer Father        leukemia  . Depression Father   . Diabetes Father   . High blood pressure Father    ROS: Review of Systems  Gastrointestinal: Negative for nausea and vomiting.  Musculoskeletal:       Negative for muscle weakness.  Psychiatric/Behavioral: Positive for depression. Negative for suicidal ideas.       Negative for homicidal ideas.   PHYSICAL EXAM: Pt in no acute distress  RECENT LABS AND TESTS: BMET    Component Value Date/Time   NA 143 12/31/2018 1410   K 4.0 12/31/2018 1410   CL 98 12/31/2018 1410   CO2 27 12/31/2018 1410   GLUCOSE 121 (H) 12/31/2018 1410   GLUCOSE 133 (H) 03/01/2016 1450   BUN 11 12/31/2018 1410   CREATININE 0.65 12/31/2018 1410   CREATININE 0.72 08/21/2011 1723   CALCIUM 9.7 12/31/2018 1410   GFRNONAA 91 12/31/2018 1410   GFRAA 105 12/31/2018 1410   Lab Results  Component Value Date   HGBA1C 5.6 12/31/2018   Lab Results  Component  Value Date   INSULIN 12.7 12/31/2018   CBC    Component Value Date/Time   WBC 6.1 03/01/2016 1450   RBC 4.69 03/01/2016 1450   HGB 14.3 03/01/2016 1450   HCT 42.9 03/01/2016 1450   PLT 206 03/01/2016 1450   MCV 91.5 03/01/2016 1450   MCH 30.5 03/01/2016 1450   MCHC 33.3 03/01/2016 1450   RDW 14.0 03/01/2016 1450   LYMPHSABS 2.3 08/21/2011 1723   MONOABS 0.5 08/21/2011 1723   EOSABS 0.2 08/21/2011 1723   BASOSABS 0.1 08/21/2011 1723   Iron/TIBC/Ferritin/ %Sat No results found for: IRON, TIBC, FERRITIN, IRONPCTSAT Lipid Panel  No results found for: CHOL, TRIG, HDL, CHOLHDL, VLDL, LDLCALC, LDLDIRECT Hepatic Function Panel     Component Value Date/Time   PROT 6.7 12/31/2018 1410   ALBUMIN 4.3 12/31/2018 1410   AST 18 12/31/2018 1410   ALT 25 12/31/2018 1410   ALKPHOS 86 12/31/2018 1410   BILITOT 0.5 12/31/2018 1410   No results found for: TSH   Results for MAX, ROMANO (MRN [id number]) as of 04/21/2019 17:18  Ref. Range 12/31/2018 14:10  Vitamin D, 25-Hydroxy Latest Ref Range: 30.0 - 100.0 ng/mL 10.8 (L)   I, Michaelene Song, am acting as Location manager for [employer], DO  I have reviewed the above documentation for accuracy and completeness, and I agree with the above. -Jearld Lesch, DO

## 2019-05-05 ENCOUNTER — Telehealth (INDEPENDENT_AMBULATORY_CARE_PROVIDER_SITE_OTHER): Payer: Medicare Other | Admitting: Bariatrics

## 2019-05-05 ENCOUNTER — Other Ambulatory Visit: Payer: Self-pay

## 2019-05-05 ENCOUNTER — Encounter (INDEPENDENT_AMBULATORY_CARE_PROVIDER_SITE_OTHER): Payer: Self-pay | Admitting: Bariatrics

## 2019-05-05 DIAGNOSIS — F3289 Other specified depressive episodes: Secondary | ICD-10-CM

## 2019-05-05 DIAGNOSIS — E559 Vitamin D deficiency, unspecified: Secondary | ICD-10-CM

## 2019-05-05 DIAGNOSIS — Z6841 Body Mass Index (BMI) 40.0 and over, adult: Secondary | ICD-10-CM | POA: Diagnosis not present

## 2019-05-05 MED ORDER — TOPIRAMATE 50 MG PO TABS: 50.0000 mg | ORAL_TABLET | Freq: Every day | ORAL | 0 refills | Status: DC

## 2019-05-06 NOTE — Progress Notes (Signed)
Office: [phone]  /  Fax: [phone] TeleHealth Visit:  Yolanda Chavez has verbally consented to this TeleHealth visit today. The patient is located at home, the provider is located at the [facility] office. The participants in this visit include the listed provider and patient. The visit was conducted today via FaceTime.  HPI:   Chief Complaint: OBESITY Yolanda Chavez is here to discuss her progress with her obesity treatment plan. She is on the Category 3 plan and is following her eating plan approximately 30% of the time. She states she is exercising 0 minutes 0 times per week. [name] states that her weight is the same (279 lbs). She had a UTI and had to be on antibiotics. She states she has "lost her rhythm with the diet". We were unable to weigh the patient today for this TeleHealth visit. She feels as if she has maintained her weight since her last visit. She has lost 0 lbs since starting treatment with [name].  Depression with emotional eating behaviors Yolanda Chavez is struggling with emotional eating and using food for comfort to the extent that it is negatively impacting her health. She often snacks when she is not hungry. Yolanda Chavez sometimes feels she is out of control and then feels guilty that she made poor food choices. She has been working on behavior modification techniques to help reduce her emotional eating and has been somewhat successful. She shows no sign of suicidal or homicidal ideations.  Depression screen PHQ 2/9 12/31/2018  Decreased Interest 3  Down, Depressed, Hopeless 1  PHQ - 2 Score 4  Altered sleeping 0  Tired, decreased energy 3  Change in appetite 2  Feeling bad or failure about yourself  0  Trouble concentrating 3  Moving slowly or fidgety/restless 0  Suicidal thoughts 0  PHQ-9 Score 12  Difficult doing work/chores Not difficult at all   Vitamin D deficiency Yolanda Chavez has a diagnosis of Vitamin D deficiency. Her last Vitamin D level was reported  to be 10.8 on 12/31/2018. She is currently taking Vit D and denies nausea, vomiting or muscle weakness.  ASSESSMENT AND PLAN:  Other depression - Plan: topiramate (TOPAMAX) 50 MG tablet  Vitamin D deficiency  BMI 50.0-59.9, adult ([place])  PLAN:  Depression with Emotional Eating Behaviors We discussed behavior modification techniques today to help [name] deal with her emotional eating and depression. [name] was given a prescription for Topamax 50 mg 1 PO a day #30 with 0 refills. She agrees to follow-up with our clinic in 2 weeks.  Vitamin D Deficiency Yolanda Chavez was informed that low Vitamin D levels contributes to fatigue and are associated with obesity, breast, and colon cancer. She agrees to continue taking Vit D and will follow-up for routine testing of Vitamin D, at least 2-3 times per year. She was informed of the risk of over-replacement of Vitamin D and agrees to not increase her dose unless she discusses this with [name] first. Yolanda Chavez agrees to follow-up with our clinic in 2 weeks.  Obesity Yolanda Chavez is currently in the action stage of change. As such, her goal is to continue with weight loss efforts. She has agreed to follow the Category 3 plan. [name] will work on meal planning, intentional eating, increase her water intake, and will stop "bad snacks". [name] has been instructed to work up to a goal of 150 minutes of combined cardio and strengthening exercise per week for weight loss and overall health benefits. We discussed the following Behavioral Modification Strategies today: increasing  lean protein intake, decreasing simple carbohydrates, increasing vegetables, increase H20 intake, decrease eating out, no skipping meals, work on meal planning and easy cooking plans, and keeping healthy foods in the home.  [name] has agreed to follow-up with our clinic in 2 weeks. She was informed of the importance of frequent follow-up visits to maximize her success with intensive lifestyle modifications  for her multiple health conditions.  ALLERGIES: Allergies  Allergen Reactions  . Adhesive [Tape] Itching and Rash    MEDICATIONS: Current Outpatient Medications on File Prior to Visit  Medication Sig Dispense Refill  . acetaminophen (TYLENOL) 325 MG tablet Take 325 mg by mouth as needed.    . divalproex (DEPAKOTE ER) 500 MG 24 hr tablet Take 1,000 mg by mouth at bedtime.     . fluvoxaMINE (LUVOX) 100 MG tablet Take 100 mg by mouth 2 (two) times daily.     [name] gabapentin (NEURONTIN) 100 MG capsule Take 100-200 mg by mouth 2 (two) times daily. Take 100 mg every morning  Take 200 mg every night    . levocetirizine (XYZAL) 5 MG tablet Take 5 mg by mouth every evening.    [name] levothyroxine (SYNTHROID, LEVOTHROID) 50 MCG tablet Take 50 mcg by mouth daily before breakfast.     . montelukast (SINGULAIR) 10 MG tablet Take 10 mg by mouth at bedtime.    . ondansetron (ZOFRAN ODT) 4 MG disintegrating tablet Take 1 tablet (4 mg total) by mouth every 8 (eight) hours as needed for nausea or vomiting. 10 tablet 0  . oxyCODONE (OXY IR/ROXICODONE) 5 MG immediate release tablet Take 5 mg by mouth every 6 (six) hours as needed for moderate pain.     . promethazine (PHENERGAN) 25 MG tablet Take 25 mg by mouth every 6 (six) hours as needed for nausea or vomiting.    [name] QUEtiapine (SEROQUEL) 100 MG tablet Take 200 mg by mouth at bedtime.     . sucralfate (CARAFATE) 1 g tablet Take 1 g by mouth 4 (four) times daily -  with meals and at bedtime.    . VENTOLIN HFA 108 (90 Base) MCG/ACT inhaler   0  . Vitamin D, Ergocalciferol, (DRISDOL) 1.25 MG (50000 UT) CAPS capsule Take 1 capsule (50,000 Units total) by mouth every 7 (seven) days. 4 capsule 0   No current facility-administered medications on file prior to visit.     PAST MEDICAL HISTORY: Past Medical History:  Diagnosis Date  . Anemia   . Anxiety   . Back pain   . Bipolar 1 disorder ([place])   . Cancer ([place])   . Depression   . Edema, lower extremity   .  Gallbladder problem   . GERD (gastroesophageal reflux disease)   . 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 History of blood transfusion   . Hypothyroidism   . Sleep apnea   . Umbilical hernia     PAST SURGICAL HISTORY: Past Surgical History:  Procedure Laterality Date  . 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 BREAST CYST EXCISION Right   . 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 gall stones    . HERNIA REPAIR  07/2011   uh with PVP, complicated by infection, mesh removal and primary repair of hernia  . JOINT REPLACEMENT     bilateral knee  . latareces    . MELANOMA EXCISION  may 2012   cancerous cells removed from  left side of nose   .  thumb surgery  1977  . TUBAL LIGATION      SOCIAL HISTORY: Social History   Tobacco Use  . Smoking status: Never Smoker  . Smokeless tobacco: Never Used  Substance Use Topics  . Alcohol use: No  . Drug use: No    FAMILY HISTORY: Family History  Problem Relation Age of Onset  . Heart disease Mother        arrythmia  . Cancer Mother   . Anxiety disorder Mother   . Cancer Father        leukemia  . Depression Father   . Diabetes Father   . High blood pressure Father    ROS: Review of Systems  Gastrointestinal: Negative for nausea and vomiting.  Musculoskeletal:       Negative for muscle weakness.  Psychiatric/Behavioral: Positive for depression. Negative for suicidal ideas.       Negative for homicidal ideas.   PHYSICAL EXAM: Pt in no acute distress  RECENT LABS AND TESTS: BMET    Component Value Date/Time   NA 143 12/31/2018 1410   K 4.0 12/31/2018 1410   CL 98 12/31/2018 1410   CO2 27 12/31/2018 1410   GLUCOSE 121 (H) 12/31/2018 1410   GLUCOSE 133 (H) 03/01/2016 1450   BUN 11 12/31/2018 1410   CREATININE 0.65 12/31/2018 1410   CREATININE 0.72 08/21/2011 1723   CALCIUM 9.7 12/31/2018 1410   GFRNONAA 91 12/31/2018 1410   GFRAA 105 12/31/2018 1410   Lab Results  Component Value Date   HGBA1C 5.6  12/31/2018   Lab Results  Component Value Date   INSULIN 12.7 12/31/2018   CBC    Component Value Date/Time   WBC 6.1 03/01/2016 1450   RBC 4.69 03/01/2016 1450   HGB 14.3 03/01/2016 1450   HCT 42.9 03/01/2016 1450   PLT 206 03/01/2016 1450   MCV 91.5 03/01/2016 1450   MCH 30.5 03/01/2016 1450   MCHC 33.3 03/01/2016 1450   RDW 14.0 03/01/2016 1450   LYMPHSABS 2.3 08/21/2011 1723   MONOABS 0.5 08/21/2011 1723   EOSABS 0.2 08/21/2011 1723   BASOSABS 0.1 08/21/2011 1723   Iron/TIBC/Ferritin/ %Sat No results found for: IRON, TIBC, FERRITIN, IRONPCTSAT Lipid Panel  No results found for: CHOL, TRIG, HDL, CHOLHDL, VLDL, LDLCALC, LDLDIRECT Hepatic Function Panel     Component Value Date/Time   PROT 6.7 12/31/2018 1410   ALBUMIN 4.3 12/31/2018 1410   AST 18 12/31/2018 1410   ALT 25 12/31/2018 1410   ALKPHOS 86 12/31/2018 1410   BILITOT 0.5 12/31/2018 1410   No results found for: TSH  Results for ETHLYN, ALTO (MRN [id number]) as of 05/06/2019 09:11  Ref. Range 12/31/2018 14:10  Vitamin D, 25-Hydroxy Latest Ref Range: 30.0 - 100.0 ng/mL 10.8 (L)   I, Michaelene Song, am acting as Location manager for [employer], DO  I have reviewed the above documentation for accuracy and completeness, and I agree with the above. -Jearld Lesch, DO

## 2019-05-19 ENCOUNTER — Encounter (INDEPENDENT_AMBULATORY_CARE_PROVIDER_SITE_OTHER): Payer: Self-pay | Admitting: Bariatrics

## 2019-05-19 ENCOUNTER — Other Ambulatory Visit: Payer: Self-pay

## 2019-05-19 ENCOUNTER — Telehealth (INDEPENDENT_AMBULATORY_CARE_PROVIDER_SITE_OTHER): Payer: Medicare Other | Admitting: Bariatrics

## 2019-05-19 DIAGNOSIS — Z6841 Body Mass Index (BMI) 40.0 and over, adult: Secondary | ICD-10-CM | POA: Diagnosis not present

## 2019-05-19 DIAGNOSIS — E559 Vitamin D deficiency, unspecified: Secondary | ICD-10-CM

## 2019-05-19 DIAGNOSIS — E8881 Metabolic syndrome: Secondary | ICD-10-CM | POA: Diagnosis not present

## 2019-05-20 NOTE — Progress Notes (Signed)
Office: [phone]  /  Fax: [phone] TeleHealth Visit:  Yolanda Chavez has verbally consented to this TeleHealth visit today. The patient is located at home, the provider is located at the [facility] office. The participants in this visit include the listed provider and patient and any and all parties involved. The visit was conducted today via FaceTime.  HPI:   Chief Complaint: OBESITY Yolanda Chavez is here to discuss her progress with her obesity treatment plan. She is on the Category 3 plan and is following her eating plan approximately 50 % of the time. She states she is exercising 0 minutes 0 times per week. Yolanda Chavez states that she has lost 4 pounds (weight 273 lbs). She states that she is not sure why she lost weight. Yolanda Chavez is taking Topamax. She is snacking less. We were unable to weigh the patient today for this TeleHealth visit. She feels as if she has lost weight since her last visit. She has lost 21 lbs since starting treatment with [name].  Insulin Resistance Yolanda Chavez has a diagnosis of insulin resistance based on her elevated fasting insulin level >5. Although Yolanda Chavez's blood glucose readings are still under good control, insulin resistance puts her at greater risk of metabolic syndrome and diabetes. [name] is not on medications and she continues to work on diet and exercise to decrease risk of diabetes.  Vitamin D deficiency Yolanda Chavez has a diagnosis of vitamin D deficiency. Her last vitamin D level was at 10.8 She is currently taking vit D and denies nausea, vomiting or muscle weakness.  Depression with emotional eating behaviors Yolanda Chavez is struggling with emotional eating and using food for comfort to the extent that it is negatively impacting her health. She often snacks when she is not hungry. Yolanda Chavez sometimes feels she is out of control and then feels guilty that she made poor food choices. Yolanda Chavez was started on Topamax at the last visit. She has been working on  behavior modification techniques to help reduce her emotional eating and has been somewhat successful. She shows no sign of suicidal or homicidal ideations.  ASSESSMENT AND PLAN:  Insulin resistance  Vitamin D deficiency  Class 3 severe obesity with serious comorbidity and body mass index (BMI) of 40.0 to 44.9 in adult, unspecified obesity type ([place])  PLAN:  Insulin Resistance Yolanda Chavez will continue to work on weight loss, exercise, increasing lean protein and decreasing simple carbohydrates in her diet to help decrease the risk of diabetes. She was informed that eating too many simple carbohydrates or too many calories at one sitting increases the likelihood of GI side effects. Yolanda Chavez agreed to follow up with [name] as directed to monitor her progress.  Vitamin D Deficiency [name] was informed that low vitamin D levels contributes to fatigue and are associated with obesity, breast, and colon cancer. She agrees to continue to take prescription Vit D @50 ,000 IU every week #4 with no refills and will follow up for routine testing of vitamin D, at least 2-3 times per year. She was informed of the risk of over-replacement of vitamin D and agrees to not increase her dose unless she discusses this with [name] first. Yolanda Chavez agrees to follow up as directed.  Depression with Emotional Eating Behaviors We discussed behavior modification techniques today to help [name] deal with her emotional eating and depression. She will continue Topamax and follow up as directed.  Obesity Yolanda Chavez is currently in the action stage of change. As such, her goal is to continue with weight  loss efforts She has agreed to follow the Category 3 plan Moe will increase activity for weight loss and overall health benefits. We discussed the following Behavioral Modification Strategies today: increase H2O intake, no skipping meals, keeping healthy foods in the home, increasing lean protein intake, decreasing simple carbohydrates,  increasing vegetables, decrease eating out and work on meal planning and easy cooking plans Yolanda Chavez will continue Topamax. Yolanda Chavez will follow the plan more closely and she will do no snacking at night.  Yolanda Chavez has agreed to follow up with our clinic in 2 weeks. She was informed of the importance of frequent follow up visits to maximize her success with intensive lifestyle modifications for her multiple health conditions.  ALLERGIES: Allergies  Allergen Reactions   Adhesive [Tape] Itching and Rash    MEDICATIONS: Current Outpatient Medications on File Prior to Visit  Medication Sig Dispense Refill   acetaminophen (TYLENOL) 325 MG tablet Take 325 mg by mouth as needed.     divalproex (DEPAKOTE ER) 500 MG 24 hr tablet Take 1,000 mg by mouth at bedtime.      fluvoxaMINE (LUVOX) 100 MG tablet Take 100 mg by mouth 2 (two) times daily.      gabapentin (NEURONTIN) 100 MG capsule Take 100-200 mg by mouth 2 (two) times daily. Take 100 mg every morning  Take 200 mg every night     levocetirizine (XYZAL) 5 MG tablet Take 5 mg by mouth every evening.     levothyroxine (SYNTHROID, LEVOTHROID) 50 MCG tablet Take 50 mcg by mouth daily before breakfast.      montelukast (SINGULAIR) 10 MG tablet Take 10 mg by mouth at bedtime.     ondansetron (ZOFRAN ODT) 4 MG disintegrating tablet Take 1 tablet (4 mg total) by mouth every 8 (eight) hours as needed for nausea or vomiting. 10 tablet 0   oxyCODONE (OXY IR/ROXICODONE) 5 MG immediate release tablet Take 5 mg by mouth every 6 (six) hours as needed for moderate pain.      promethazine (PHENERGAN) 25 MG tablet Take 25 mg by mouth every 6 (six) hours as needed for nausea or vomiting.     QUEtiapine (SEROQUEL) 100 MG tablet Take 200 mg by mouth at bedtime.      sucralfate (CARAFATE) 1 g tablet Take 1 g by mouth 4 (four) times daily -  with meals and at bedtime.     topiramate (TOPAMAX) 50 MG tablet Take 1 tablet (50 mg total) by mouth daily. 30 tablet 0    VENTOLIN HFA 108 (90 Base) MCG/ACT inhaler   0   Vitamin D, Ergocalciferol, (DRISDOL) 1.25 MG (50000 UT) CAPS capsule Take 1 capsule (50,000 Units total) by mouth every 7 (seven) days. 4 capsule 0   No current facility-administered medications on file prior to visit.     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   BREAST CYST EXCISION Right    BREAST SURGERY  fobrocyst tumor on right breast    gall stones     HERNIA REPAIR  07/2011   uh with PVP, complicated by infection, mesh removal and primary repair of hernia   JOINT REPLACEMENT     bilateral knee   latareces     MELANOMA EXCISION  may 2012   cancerous cells removed from  left side of nose    thumb surgery  1977   TUBAL LIGATION      SOCIAL HISTORY: Social History   Tobacco Use   Smoking status: Never Smoker   Smokeless tobacco: Never Used  Substance Use Topics   Alcohol use: No   Drug use: No    FAMILY HISTORY: Family History  Problem Relation Age of Onset   Heart disease Mother        arrythmia   Cancer Mother    Anxiety disorder Mother    Cancer Father        leukemia   Depression Father    Diabetes Father    High blood pressure Father     ROS: Review of Systems  Constitutional: Positive for weight loss.  Gastrointestinal: Negative for nausea and vomiting.  Musculoskeletal:       Negative for muscle weakness  Psychiatric/Behavioral: Positive for depression. Negative for suicidal ideas.    PHYSICAL EXAM: Pt in no acute  distress  RECENT LABS AND TESTS: BMET    Component Value Date/Time   NA 143 12/31/2018 1410   K 4.0 12/31/2018 1410   CL 98 12/31/2018 1410   CO2 27 12/31/2018 1410   GLUCOSE 121 (H) 12/31/2018 1410   GLUCOSE 133 (H) 03/01/2016 1450   BUN 11 12/31/2018 1410   CREATININE 0.65 12/31/2018 1410   CREATININE 0.72 08/21/2011 1723   CALCIUM 9.7 12/31/2018 1410   GFRNONAA 91 12/31/2018 1410   GFRAA 105 12/31/2018 1410   Lab Results  Component Value Date   HGBA1C 5.6 12/31/2018   Lab Results  Component Value Date   INSULIN 12.7 12/31/2018   CBC    Component Value Date/Time   WBC 6.1 03/01/2016 1450   RBC 4.69 03/01/2016 1450   HGB 14.3 03/01/2016 1450   HCT 42.9 03/01/2016 1450   PLT 206 03/01/2016 1450   MCV 91.5 03/01/2016 1450   MCH 30.5 03/01/2016 1450   MCHC 33.3 03/01/2016 1450   RDW 14.0 03/01/2016 1450   LYMPHSABS 2.3 08/21/2011 1723   MONOABS 0.5 08/21/2011 1723   EOSABS 0.2 08/21/2011 1723   BASOSABS 0.1 08/21/2011 1723   Iron/TIBC/Ferritin/ %Sat No results found for: IRON, TIBC, FERRITIN, IRONPCTSAT Lipid Panel  No results found for: CHOL, TRIG, HDL, CHOLHDL, VLDL, LDLCALC, LDLDIRECT Hepatic Function Panel     Component Value Date/Time   PROT 6.7 12/31/2018 1410   ALBUMIN 4.3 12/31/2018 1410   AST 18 12/31/2018 1410   ALT 25 12/31/2018 1410   ALKPHOS 86 12/31/2018 1410   BILITOT 0.5 12/31/2018 1410   No results found for: TSH   Ref. Range 12/31/2018 14:10  Vitamin D, 25-Hydroxy Latest Ref Range: 30.0 - 100.0 ng/mL 10.8 (L)    I, Doreene Nest, am acting as Location manager for [employer]. Owens Shark, DO  I have reviewed the above documentation for accuracy and completeness, and I agree with the above. -Jearld Lesch, DO

## 2019-05-24 ENCOUNTER — Other Ambulatory Visit (INDEPENDENT_AMBULATORY_CARE_PROVIDER_SITE_OTHER): Payer: Self-pay | Admitting: Bariatrics

## 2019-05-24 DIAGNOSIS — E559 Vitamin D deficiency, unspecified: Secondary | ICD-10-CM

## 2019-06-02 ENCOUNTER — Encounter (INDEPENDENT_AMBULATORY_CARE_PROVIDER_SITE_OTHER): Payer: Self-pay | Admitting: Bariatrics

## 2019-06-02 ENCOUNTER — Other Ambulatory Visit: Payer: Self-pay

## 2019-06-02 ENCOUNTER — Telehealth (INDEPENDENT_AMBULATORY_CARE_PROVIDER_SITE_OTHER): Payer: Medicare Other | Admitting: Bariatrics

## 2019-06-02 DIAGNOSIS — E559 Vitamin D deficiency, unspecified: Secondary | ICD-10-CM

## 2019-06-02 DIAGNOSIS — F3289 Other specified depressive episodes: Secondary | ICD-10-CM | POA: Diagnosis not present

## 2019-06-02 DIAGNOSIS — Z6841 Body Mass Index (BMI) 40.0 and over, adult: Secondary | ICD-10-CM | POA: Diagnosis not present

## 2019-06-02 DIAGNOSIS — E8881 Metabolic syndrome: Secondary | ICD-10-CM

## 2019-06-02 MED ORDER — TOPIRAMATE 50 MG PO TABS: 50.0000 mg | ORAL_TABLET | Freq: Every day | ORAL | 0 refills | Status: DC

## 2019-06-04 NOTE — Progress Notes (Signed)
Office: [phone]  /  Fax: [phone] TeleHealth Visit:  Yolanda Chavez has verbally consented to this TeleHealth visit today. The patient is located at home, the provider is located at the [facility] office. The participants in this visit include the listed provider and patient and any and all parties involved. The visit was conducted today via FaceTime.  HPI:   Chief Complaint: OBESITY Che is here to discuss her progress with her obesity treatment plan. She is on the Category 3 plan and is following her eating plan approximately 10 % of the time. She states she is exercising 0 minutes 0 times per week. Yolanda Chavez states that she is up four pounds (weight 277 lbs 06/02/19). She is taking Topamax. [name] has been under more stress, and she has not been following her plan. We were unable to weigh the patient today for this TeleHealth visit. She feels as if she has gained weight since her last visit. She has lost 17 lbs since starting treatment with [name].  Insulin Resistance Yolanda Chavez has a diagnosis of insulin resistance based on her elevated fasting insulin level >5. Although Yolanda Chavez's blood glucose readings are still under good control, insulin resistance puts her at greater risk of metabolic syndrome and diabetes. Her last A1c was at 5.6 and last insulin level was at 12.7 Yolanda Chavez is not taking metformin currently and continues to work on diet and exercise to decrease risk of diabetes.  Vitamin D deficiency Yolanda Chavez has a diagnosis of vitamin D deficiency. Her last vitamin D level was at 10.8 Yolanda Chavez is currently taking vit D and she denies nausea, vomiting or muscle weakness.  Depression with emotional eating behaviors Yolanda Chavez is struggling with emotional eating and using food for comfort to the extent that it is negatively impacting her health. She often snacks when she is not hungry. Yolanda Chavez sometimes feels she is out of control and then feels guilty that she made poor food  choices. She has a Engineer, water and a psychiatrist. She has been working on behavior modification techniques to help reduce her emotional eating and has been somewhat successful. She shows no sign of suicidal or homicidal ideations.  ASSESSMENT AND PLAN:  Insulin resistance  Vitamin D deficiency  BMI 50.0-59.9, adult ([place])  Other depression - Plan: topiramate (TOPAMAX) 50 MG tablet  PLAN:  Insulin Resistance Yolanda Chavez will continue to work on weight loss, increasing activity, increasing lean protein and decreasing simple carbohydrates in her diet to help decrease the risk of diabetes. She was informed that eating too many simple carbohydrates or too many calories at one sitting increases the likelihood of GI side effects. Yolanda Chavez agreed to follow up with [name] as directed to monitor her progress.  Vitamin D Deficiency Yolanda Chavez was informed that low vitamin D levels contributes to fatigue and are associated with obesity, breast, and colon cancer. [name] will continue to take prescription Vit D @50 ,000 IU every week and she will follow up for routine testing of vitamin D, at least 2-3 times per year. She was informed of the risk of over-replacement of vitamin D and agrees to not increase her dose unless she discusses this with [name] first.  Depression with Emotional Eating Behaviors We discussed behavior modification techniques today to help Yolanda Chavez deal with her emotional eating and depression. She has follow up with her psychologist and she will call for an appointment (emotional eating). Yolanda Chavez will continue to take Topamax and follow up as directed.  Obesity Yolanda Chavez is currently in the action  stage of change. As such, her goal is to continue with weight loss efforts She has agreed to follow the Category 3 plan Yolanda Chavez has been instructed to work up to a goal of 150 minutes of combined cardio and strengthening exercise per week for weight loss and overall health benefits. We discussed the following  Behavioral Modification Strategies today: planning for success, increase H2O intake, no skipping meals, keeping healthy foods in the home, increasing lean protein intake, decreasing simple carbohydrates, increasing vegetables, decrease eating out and work on meal planning and intentional eating  Yolanda Chavez has agreed to follow up with our clinic in 2 weeks. She was informed of the importance of frequent follow up visits to maximize her success with intensive lifestyle modifications for her multiple health conditions.  ALLERGIES: Allergies  Allergen Reactions   Adhesive [Tape] Itching and Rash    MEDICATIONS: Current Outpatient Medications on File Prior to Visit  Medication Sig Dispense Refill   acetaminophen (TYLENOL) 325 MG tablet Take 325 mg by mouth as needed.     divalproex (DEPAKOTE ER) 500 MG 24 hr tablet Take 1,000 mg by mouth at bedtime.      fluvoxaMINE (LUVOX) 100 MG tablet Take 100 mg by mouth 2 (two) times daily.      gabapentin (NEURONTIN) 100 MG capsule Take 100-200 mg by mouth 2 (two) times daily. Take 100 mg every morning  Take 200 mg every night     levocetirizine (XYZAL) 5 MG tablet Take 5 mg by mouth every evening.     levothyroxine (SYNTHROID, LEVOTHROID) 50 MCG tablet Take 50 mcg by mouth daily before breakfast.      montelukast (SINGULAIR) 10 MG tablet Take 10 mg by mouth at bedtime.     ondansetron (ZOFRAN ODT) 4 MG disintegrating tablet Take 1 tablet (4 mg total) by mouth every 8 (eight) hours as needed for nausea or vomiting. 10 tablet 0   oxyCODONE (OXY IR/ROXICODONE) 5 MG immediate release tablet Take 5 mg by mouth every 6 (six) hours as needed for moderate pain.      promethazine (PHENERGAN) 25 MG tablet Take 25 mg by mouth every 6 (six) hours as needed for nausea or vomiting.     QUEtiapine (SEROQUEL) 100 MG tablet Take 200 mg by mouth at bedtime.      sucralfate (CARAFATE) 1 g tablet Take 1 g by mouth 4 (four) times daily -  with meals and at bedtime.      VENTOLIN HFA 108 (90 Base) MCG/ACT inhaler   0   Vitamin D, Ergocalciferol, (DRISDOL) 1.25 MG (50000 UT) CAPS capsule TAKE 1 CAPSULE (50,000 UNITS TOTAL) BY MOUTH EVERY 7 (SEVEN) DAYS. 4 capsule 0   No current facility-administered medications on file prior to visit.     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   BREAST CYST EXCISION Right    BREAST SURGERY     fobrocyst tumor on right breast  gall stones     HERNIA REPAIR  07/2011   uh with PVP, complicated by infection, mesh removal and primary repair of hernia   JOINT REPLACEMENT     bilateral knee   latareces     MELANOMA EXCISION  may 2012   cancerous cells removed from  left side of nose    thumb surgery  1977   TUBAL LIGATION      SOCIAL HISTORY: Social History   Tobacco Use   Smoking status: Never Smoker   Smokeless tobacco: Never Used  Substance Use Topics   Alcohol use: No   Drug use: No    FAMILY HISTORY: Family History  Problem Relation Age of Onset   Heart disease Mother        arrythmia   Cancer Mother    Anxiety disorder Mother    Cancer Father        leukemia   Depression Father    Diabetes Father    High blood pressure Father     ROS: Review of Systems  Constitutional: Negative for weight loss.  Gastrointestinal: Negative for nausea and vomiting.  Musculoskeletal:       Negative for muscle weakness  Psychiatric/Behavioral: Positive for depression. Negative for suicidal ideas.    PHYSICAL EXAM: Pt in no acute  distress  RECENT LABS AND TESTS: BMET    Component Value Date/Time   NA 143 12/31/2018 1410   K 4.0 12/31/2018 1410   CL 98 12/31/2018 1410   CO2 27 12/31/2018 1410   GLUCOSE 121 (H) 12/31/2018 1410   GLUCOSE 133 (H) 03/01/2016 1450   BUN 11 12/31/2018 1410   CREATININE 0.65 12/31/2018 1410   CREATININE 0.72 08/21/2011 1723   CALCIUM 9.7 12/31/2018 1410   GFRNONAA 91 12/31/2018 1410   GFRAA 105 12/31/2018 1410   Lab Results  Component Value Date   HGBA1C 5.6 12/31/2018   Lab Results  Component Value Date   INSULIN 12.7 12/31/2018   CBC    Component Value Date/Time   WBC 6.1 03/01/2016 1450   RBC 4.69 03/01/2016 1450   HGB 14.3 03/01/2016 1450   HCT 42.9 03/01/2016 1450   PLT 206 03/01/2016 1450   MCV 91.5 03/01/2016 1450   MCH 30.5 03/01/2016 1450   MCHC 33.3 03/01/2016 1450   RDW 14.0 03/01/2016 1450   LYMPHSABS 2.3 08/21/2011 1723   MONOABS 0.5 08/21/2011 1723   EOSABS 0.2 08/21/2011 1723   BASOSABS 0.1 08/21/2011 1723   Iron/TIBC/Ferritin/ %Sat No results found for: IRON, TIBC, FERRITIN, IRONPCTSAT Lipid Panel  No results found for: CHOL, TRIG, HDL, CHOLHDL, VLDL, LDLCALC, LDLDIRECT Hepatic Function Panel     Component Value Date/Time   PROT 6.7 12/31/2018 1410   ALBUMIN 4.3 12/31/2018 1410   AST 18 12/31/2018 1410   ALT 25 12/31/2018 1410   ALKPHOS 86 12/31/2018 1410   BILITOT 0.5 12/31/2018 1410   No results found for: TSH    Ref. Range 12/31/2018 14:10  Vitamin D, 25-Hydroxy Latest Ref Range: 30.0 - 100.0 ng/mL 10.8 (L)    I, Doreene Nest, am acting as Location manager for [employer]. Owens Shark, DO  I have reviewed the above documentation for accuracy and completeness, and I agree with the above. -Jearld Lesch, DO

## 2019-06-16 ENCOUNTER — Other Ambulatory Visit: Payer: Self-pay

## 2019-06-16 ENCOUNTER — Encounter (INDEPENDENT_AMBULATORY_CARE_PROVIDER_SITE_OTHER): Payer: Self-pay | Admitting: Bariatrics

## 2019-06-16 ENCOUNTER — Telehealth (INDEPENDENT_AMBULATORY_CARE_PROVIDER_SITE_OTHER): Payer: Medicare Other | Admitting: Bariatrics

## 2019-06-16 DIAGNOSIS — E559 Vitamin D deficiency, unspecified: Secondary | ICD-10-CM | POA: Diagnosis not present

## 2019-06-16 DIAGNOSIS — Z6841 Body Mass Index (BMI) 40.0 and over, adult: Secondary | ICD-10-CM

## 2019-06-16 DIAGNOSIS — E8881 Metabolic syndrome: Secondary | ICD-10-CM | POA: Diagnosis not present

## 2019-06-16 MED ORDER — VITAMIN D (ERGOCALCIFEROL) 1.25 MG (50000 UNIT) PO CAPS: 50000.0000 [IU] | ORAL_CAPSULE | ORAL | 0 refills | Status: DC

## 2019-06-18 NOTE — Progress Notes (Signed)
Office: [phone]  /  Fax: [phone] TeleHealth Visit:  Yolanda Chavez has verbally consented to this TeleHealth visit today. The patient is located at home, the provider is located at the [facility] office. The participants in this visit include the listed provider and patient and any and all parties involved. The visit was conducted today via FaceTime.  HPI:   Chief Complaint: OBESITY Yolanda Chavez is here to discuss her progress with her obesity treatment plan. She is on the Category 3 plan and is following her eating plan approximately 40 % of the time. She states she is exercising 0 minutes 0 times per week. Yolanda Chavez states that she is down 1 pound (weight 276 lbs 06/16/19). She has the family in from [place] (more celebrations). Yolanda Chavez is doing minimal stress eating. She is doing well with her water and protein. We were unable to weigh the patient today for this TeleHealth visit. She feels as if she has lost weight since her last visit. She has lost 17 lbs since starting treatment with [name].  Vitamin D deficiency Yolanda Chavez has a diagnosis of vitamin D deficiency. She is currently taking vit D and denies nausea, vomiting or muscle weakness.  Insulin Resistance Yolanda Chavez has a diagnosis of insulin resistance based on her elevated fasting insulin level >5. Although Yolanda Chavez's blood glucose readings are still under good control, insulin resistance puts her at greater risk of metabolic syndrome and diabetes. She is not taking metformin currently and continues to work on diet and exercise to decrease risk of diabetes. Yolanda Chavez denies polyphagia.  ASSESSMENT AND PLAN:  Vitamin D deficiency - Plan: Vitamin D, Ergocalciferol, (DRISDOL) 1.25 MG (50000 UT) CAPS capsule  Insulin resistance  Class 3 severe obesity with serious comorbidity and body mass index (BMI) of 50.0 to 59.9 in adult, unspecified obesity type ([place])  PLAN:  Vitamin D Deficiency Yolanda Chavez was informed that low  vitamin D levels contributes to fatigue and are associated with obesity, breast, and colon cancer. She agrees to continue to take prescription Vit D @50 ,000 IU every week #4 with no refills and she will follow up for routine testing of vitamin D, at least 2-3 times per year. She was informed of the risk of over-replacement of vitamin D and agrees to not increase her dose unless she discusses this with [name] first. Yolanda Chavez agrees to follow up with our clinic in 3 to 4 weeks.  Insulin Resistance Yolanda Chavez will continue to work on weight loss, exercise, increasing lean protein and decreasing simple carbohydrates in her diet to help decrease the risk of diabetes. She was informed that eating too many simple carbohydrates or too many calories at one sitting increases the likelihood of GI side effects. Yolanda Chavez will consider increasing activity. Yolanda Chavez agreed to follow up with [name] as directed to monitor her progress.  Obesity Yolanda Chavez is currently in the action stage of change. As such, her goal is to continue with weight loss efforts She has agreed to follow the Category 3 plan Yolanda Chavez will remain active for weight loss and overall health benefits. We discussed the following Behavioral Modification Strategies today: increase H2O intake, no skipping meals, keeping healthy foods in the home, increasing lean protein intake, decreasing simple carbohydrates, increasing vegetables, decrease eating out and work on meal planning and intentional eating Goal: below 269 lbs  Yolanda Chavez has agreed to follow up with our clinic in 3 to 4 weeks. She was informed of the importance of frequent follow up visits to maximize her success  with intensive lifestyle modifications for her multiple health conditions.  ALLERGIES: Allergies  Allergen Reactions  . Adhesive [Tape] Itching and Rash    MEDICATIONS: Current Outpatient Medications on File Prior to Visit  Medication Sig Dispense Refill  . acetaminophen (TYLENOL) 325 MG tablet Take  325 mg by mouth as needed.    . divalproex (DEPAKOTE ER) 500 MG 24 hr tablet Take 1,000 mg by mouth at bedtime.     . fluvoxaMINE (LUVOX) 100 MG tablet Take 100 mg by mouth 2 (two) times daily.     [name] gabapentin (NEURONTIN) 100 MG capsule Take 100-200 mg by mouth 2 (two) times daily. Take 100 mg every morning  Take 200 mg every night    . levocetirizine (XYZAL) 5 MG tablet Take 5 mg by mouth every evening.    [name] levothyroxine (SYNTHROID, LEVOTHROID) 50 MCG tablet Take 50 mcg by mouth daily before breakfast.     . montelukast (SINGULAIR) 10 MG tablet Take 10 mg by mouth at bedtime.    . ondansetron (ZOFRAN ODT) 4 MG disintegrating tablet Take 1 tablet (4 mg total) by mouth every 8 (eight) hours as needed for nausea or vomiting. 10 tablet 0  . oxyCODONE (OXY IR/ROXICODONE) 5 MG immediate release tablet Take 5 mg by mouth every 6 (six) hours as needed for moderate pain.     . promethazine (PHENERGAN) 25 MG tablet Take 25 mg by mouth every 6 (six) hours as needed for nausea or vomiting.    [name] QUEtiapine (SEROQUEL) 100 MG tablet Take 200 mg by mouth at bedtime.     . sucralfate (CARAFATE) 1 g tablet Take 1 g by mouth 4 (four) times daily -  with meals and at bedtime.    . topiramate (TOPAMAX) 50 MG tablet Take 1 tablet (50 mg total) by mouth daily. 30 tablet 0  . VENTOLIN HFA 108 (90 Base) MCG/ACT inhaler   0   No current facility-administered medications on file prior to visit.     PAST MEDICAL HISTORY: Past Medical History:  Diagnosis Date  . Anemia   . Anxiety   . Back pain   . Bipolar 1 disorder ([place])   . Cancer ([place])   . Depression   . Edema, lower extremity   . Gallbladder problem   . GERD (gastroesophageal reflux disease)   . Hemolytic anemia ([place])   . History of blood transfusion   . Hypothyroidism   . Sleep apnea   . Umbilical hernia     PAST SURGICAL HISTORY: Past Surgical History:  Procedure Laterality Date  . 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 BREAST CYST EXCISION  Right   . 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 gall stones    . HERNIA REPAIR  07/2011   uh with PVP, complicated by infection, mesh removal and primary repair of hernia  . JOINT REPLACEMENT     bilateral knee  . latareces    . MELANOMA EXCISION  may 2012   cancerous cells removed from  left side of nose   . thumb surgery  1977  . TUBAL LIGATION      SOCIAL HISTORY: Social History   Tobacco Use  . Smoking status: Never Smoker  . Smokeless tobacco: Never Used  Substance Use Topics  . Alcohol use: No  . Drug use: No    FAMILY HISTORY: Family History  Problem Relation Age of Onset  . Heart disease Mother  arrythmia  . Cancer Mother   . Anxiety disorder Mother   . Cancer Father        leukemia  . Depression Father   . Diabetes Father   . High blood pressure Father     ROS: Review of Systems  Constitutional: Positive for weight loss.  Gastrointestinal: Negative for nausea and vomiting.  Musculoskeletal:       Negative for muscle weakness  Endo/Heme/Allergies:       Negative for polyphagia    PHYSICAL EXAM: Pt in no acute distress  RECENT LABS AND TESTS: BMET    Component Value Date/Time   NA 143 12/31/2018 1410   K 4.0 12/31/2018 1410   CL 98 12/31/2018 1410   CO2 27 12/31/2018 1410   GLUCOSE 121 (H) 12/31/2018 1410   GLUCOSE 133 (H) 03/01/2016 1450   BUN 11 12/31/2018 1410   CREATININE 0.65 12/31/2018 1410   CREATININE 0.72 08/21/2011 1723   CALCIUM 9.7 12/31/2018 1410   GFRNONAA 91 12/31/2018 1410   GFRAA 105 12/31/2018 1410   Lab Results  Component Value Date   HGBA1C 5.6 12/31/2018   Lab Results  Component Value Date   INSULIN 12.7 12/31/2018   CBC    Component Value Date/Time   WBC 6.1 03/01/2016 1450   RBC 4.69 03/01/2016 1450   HGB 14.3 03/01/2016 1450   HCT 42.9 03/01/2016 1450   PLT 206 03/01/2016 1450   MCV 91.5 03/01/2016 1450   MCH 30.5 03/01/2016 1450   MCHC 33.3 03/01/2016 1450   RDW 14.0  03/01/2016 1450   LYMPHSABS 2.3 08/21/2011 1723   MONOABS 0.5 08/21/2011 1723   EOSABS 0.2 08/21/2011 1723   BASOSABS 0.1 08/21/2011 1723   Iron/TIBC/Ferritin/ %Sat No results found for: IRON, TIBC, FERRITIN, IRONPCTSAT Lipid Panel  No results found for: CHOL, TRIG, HDL, CHOLHDL, VLDL, LDLCALC, LDLDIRECT Hepatic Function Panel     Component Value Date/Time   PROT 6.7 12/31/2018 1410   ALBUMIN 4.3 12/31/2018 1410   AST 18 12/31/2018 1410   ALT 25 12/31/2018 1410   ALKPHOS 86 12/31/2018 1410   BILITOT 0.5 12/31/2018 1410   No results found for: TSH    Ref. Range 12/31/2018 14:10  Vitamin D, 25-Hydroxy Latest Ref Range: 30.0 - 100.0 ng/mL 10.8 (L)    I, Doreene Nest, am acting as Location manager for [employer]. Owens Shark, DO  I have reviewed the above documentation for accuracy and completeness, and I agree with the above. -Jearld Lesch, DO

## 2019-07-07 ENCOUNTER — Encounter (INDEPENDENT_AMBULATORY_CARE_PROVIDER_SITE_OTHER): Payer: Self-pay | Admitting: Bariatrics

## 2019-07-07 ENCOUNTER — Other Ambulatory Visit: Payer: Self-pay

## 2019-07-07 ENCOUNTER — Telehealth (INDEPENDENT_AMBULATORY_CARE_PROVIDER_SITE_OTHER): Payer: Medicare Other | Admitting: Bariatrics

## 2019-07-07 DIAGNOSIS — E8881 Metabolic syndrome: Secondary | ICD-10-CM | POA: Diagnosis not present

## 2019-07-07 DIAGNOSIS — Z6841 Body Mass Index (BMI) 40.0 and over, adult: Secondary | ICD-10-CM | POA: Diagnosis not present

## 2019-07-07 DIAGNOSIS — F3289 Other specified depressive episodes: Secondary | ICD-10-CM

## 2019-07-07 MED ORDER — TOPIRAMATE 50 MG PO TABS: 50.0000 mg | ORAL_TABLET | Freq: Every day | ORAL | 0 refills | Status: DC

## 2019-07-07 MED ORDER — METFORMIN HCL 500 MG PO TABS: 500.0000 mg | ORAL_TABLET | Freq: Every day | ORAL | 0 refills | Status: DC

## 2019-07-09 NOTE — Progress Notes (Signed)
Office: [phone]  /  Fax: [phone] TeleHealth Visit:  Yolanda Chavez has verbally consented to this TeleHealth visit today. The patient is located at home, the provider is located at the [employer] [facility] office. The participants in this visit include the listed provider and patient. The visit was conducted today via face time.  HPI:   Chief Complaint: OBESITY Yolanda Chavez is here to discuss her progress with her obesity treatment plan. She is on the Category 3 plan and is following her eating plan approximately 10 % of the time. She states she is exercising 0 minutes 0 times per week. [name] is up 1 lb (277 lbs). She is struggling with the diet (wanting to eat ice cream, starches, and mac and cheese).  We were unable to weigh the patient today for this TeleHealth visit. She feels as if she has gained 1 lb since her last visit. She has lost 17 lbs since starting treatment with [name].  Insulin Resistance Yolanda Chavez has a diagnosis of insulin resistance based on her elevated fasting insulin level >5. Last A1c was 5.6 and insulin of 12.7. Although Yolanda Chavez's blood glucose readings are still under good control, insulin resistance puts her at greater risk of metabolic syndrome and diabetes. She is not taking metformin currently and continues to work on diet and exercise to decrease risk of diabetes.  Depression with Emotional Eating Behaviors Yolanda Chavez is struggling with emotional eating and using food for comfort to the extent that it is negatively impacting her health. She often snacks when she is not hungry. Yolanda Chavez sometimes feels she is out of control and then feels guilty that she made poor food choices. She has been working on behavior modification techniques to help reduce her emotional eating and has been somewhat successful. She shows no sign of suicidal or homicidal ideations.  Depression screen [facility] 2/9 12/31/2018  Decreased Interest 3  Down, Depressed, Hopeless 1  PHQ - 2 Score 4   Altered sleeping 0  Tired, decreased energy 3  Change in appetite 2  Feeling bad or failure about yourself  0  Trouble concentrating 3  Moving slowly or fidgety/restless 0  Suicidal thoughts 0  PHQ-9 Score 12  Difficult doing work/chores Not difficult at all    ASSESSMENT AND PLAN:  Other depression - with emotional eating  - Plan: topiramate (TOPAMAX) 50 MG tablet  Insulin resistance  Class 3 severe obesity with serious comorbidity and body mass index (BMI) of 50.0 to 59.9 in adult, unspecified obesity type ([place])  PLAN:  Insulin Resistance Yolanda Chavez will continue to work on weight loss, exercise, increase protein, and decreasing simple carbohydrates in her diet to help decrease the risk of diabetes. We dicussed metformin including benefits and risks. She was informed that eating too many simple carbohydrates or too many calories at one sitting increases the likelihood of GI side effects. Lamont agrees to start metformin 500 mg 1 table PO at lunch daily #30 with no refills. Yolanda Chavez agrees to follow up with our clinic in 2 weeks as directed to monitor her progress.  Depression with Emotional Eating Behaviors We discussed behavior modification techniques today to help Yolanda Chavez deal with her emotional eating and depression. [name] agrees to continue taking Topamax 50 mg 1 tablet PO daily #30 and we will refill for 1 month. Yolanda Chavez agrees to follow up with our clinic in 2 weeks.  Obesity Yolanda Chavez is currently in the action stage of change. As such, her goal is to continue with weight loss  efforts She has agreed to follow the Category 3 plan Yolanda Chavez has been instructed to work up to a goal of 150 minutes of combined cardio and strengthening exercise per week for weight loss and overall health benefits. We discussed the following Behavioral Modification Strategies today: increasing lean protein intake, decreasing simple carbohydrates, increasing vegetables, decrease eating out, increase H20 intake,  no skipping meals, work on meal planning and easy cooking plans, keeping healthy foods in the home, and planning for success   Yolanda Chavez has agreed to follow up with our clinic in 2 weeks. She was informed of the importance of frequent follow up visits to maximize her success with intensive lifestyle modifications for her multiple health conditions.  ALLERGIES: Allergies  Allergen Reactions  . Adhesive [Tape] Itching and Rash    MEDICATIONS: Current Outpatient Medications on File Prior to Visit  Medication Sig Dispense Refill  . acetaminophen (TYLENOL) 325 MG tablet Take 325 mg by mouth as needed.    . divalproex (DEPAKOTE ER) 500 MG 24 hr tablet Take 1,000 mg by mouth at bedtime.     . fluvoxaMINE (LUVOX) 100 MG tablet Take 100 mg by mouth 2 (two) times daily.     [name] gabapentin (NEURONTIN) 100 MG capsule Take 100-200 mg by mouth 2 (two) times daily. Take 100 mg every morning  Take 200 mg every night    . levocetirizine (XYZAL) 5 MG tablet Take 5 mg by mouth every evening.    [name] levothyroxine (SYNTHROID, LEVOTHROID) 50 MCG tablet Take 50 mcg by mouth daily before breakfast.     . montelukast (SINGULAIR) 10 MG tablet Take 10 mg by mouth at bedtime.    . ondansetron (ZOFRAN ODT) 4 MG disintegrating tablet Take 1 tablet (4 mg total) by mouth every 8 (eight) hours as needed for nausea or vomiting. 10 tablet 0  . oxyCODONE (OXY IR/ROXICODONE) 5 MG immediate release tablet Take 5 mg by mouth every 6 (six) hours as needed for moderate pain.     . promethazine (PHENERGAN) 25 MG tablet Take 25 mg by mouth every 6 (six) hours as needed for nausea or vomiting.    [name] QUEtiapine (SEROQUEL) 100 MG tablet Take 200 mg by mouth at bedtime.     . sucralfate (CARAFATE) 1 g tablet Take 1 g by mouth 4 (four) times daily -  with meals and at bedtime.    . VENTOLIN HFA 108 (90 Base) MCG/ACT inhaler   0  . Vitamin D, Ergocalciferol, (DRISDOL) 1.25 MG (50000 UT) CAPS capsule Take 1 capsule (50,000 Units total) by mouth  every 7 (seven) days. 4 capsule 0   No current facility-administered medications on file prior to visit.     PAST MEDICAL HISTORY: Past Medical History:  Diagnosis Date  . Anemia   . Anxiety   . Back pain   . Bipolar 1 disorder ([place]   . Cancer ([place])   . Depression   . Edema, lower extremity   . Gallbladder problem   . GERD (gastroesophageal reflux disease)   . 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 History of blood transfusion   . Hypothyroidism   . Sleep apnea   . Umbilical hernia     PAST SURGICAL HISTORY: Past Surgical History:  Procedure Laterality Date  . 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 BREAST CYST EXCISION Right   . 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 gall stones    . HERNIA REPAIR  07/2011   uh with PVP, complicated by infection, mesh removal and primary repair of hernia  . JOINT REPLACEMENT     bilateral knee  . latareces    . MELANOMA EXCISION  may 2012   cancerous cells removed from  left side of nose   . thumb surgery  1977  . TUBAL LIGATION      SOCIAL HISTORY: Social History   Tobacco Use  . Smoking status: Never Smoker  . Smokeless tobacco: Never Used  Substance Use Topics  . Alcohol use: No  . Drug use: No    FAMILY HISTORY: Family History  Problem Relation Age of Onset  . Heart disease Mother        arrythmia  . Cancer Mother   . Anxiety disorder Mother   . Cancer Father        leukemia  . Depression Father   . Diabetes Father   . High blood pressure Father     ROS: Review of Systems  Constitutional: Negative for weight loss.  Psychiatric/Behavioral: Positive for depression. Negative for suicidal ideas.    PHYSICAL EXAM: Pt in no acute distress  RECENT LABS AND TESTS: BMET    Component Value Date/Time   NA 143 12/31/2018 1410   K 4.0 12/31/2018 1410   CL 98 12/31/2018 1410   CO2 27 12/31/2018 1410   GLUCOSE 121 (H) 12/31/2018 1410   GLUCOSE 133 (H) 03/01/2016 1450   BUN 11 12/31/2018 1410    CREATININE 0.65 12/31/2018 1410   CREATININE 0.72 08/21/2011 1723   CALCIUM 9.7 12/31/2018 1410   GFRNONAA 91 12/31/2018 1410   GFRAA 105 12/31/2018 1410   Lab Results  Component Value Date   HGBA1C 5.6 12/31/2018   Lab Results  Component Value Date   INSULIN 12.7 12/31/2018   CBC    Component Value Date/Time   WBC 6.1 03/01/2016 1450   RBC 4.69 03/01/2016 1450   HGB 14.3 03/01/2016 1450   HCT 42.9 03/01/2016 1450   PLT 206 03/01/2016 1450   MCV 91.5 03/01/2016 1450   MCH 30.5 03/01/2016 1450   MCHC 33.3 03/01/2016 1450   RDW 14.0 03/01/2016 1450   LYMPHSABS 2.3 08/21/2011 1723   MONOABS 0.5 08/21/2011 1723   EOSABS 0.2 08/21/2011 1723   BASOSABS 0.1 08/21/2011 1723   Iron/TIBC/Ferritin/ %Sat No results found for: IRON, TIBC, FERRITIN, IRONPCTSAT Lipid Panel  No results found for: CHOL, TRIG, HDL, CHOLHDL, VLDL, LDLCALC, LDLDIRECT Hepatic Function Panel     Component Value Date/Time   PROT 6.7 12/31/2018 1410   ALBUMIN 4.3 12/31/2018 1410   AST 18 12/31/2018 1410   ALT 25 12/31/2018 1410   ALKPHOS 86 12/31/2018 1410   BILITOT 0.5 12/31/2018 1410   No results found for: TSH    I, Trixie Dredge, am acting as transcriptionist for [employer], DO  I have reviewed the above documentation for accuracy and completeness, and I agree with the above. Jearld Lesch, DO

## 2019-07-21 ENCOUNTER — Ambulatory Visit (INDEPENDENT_AMBULATORY_CARE_PROVIDER_SITE_OTHER): Payer: Medicare Other | Admitting: Bariatrics

## 2019-07-21 ENCOUNTER — Encounter (INDEPENDENT_AMBULATORY_CARE_PROVIDER_SITE_OTHER): Payer: Self-pay | Admitting: Bariatrics

## 2019-07-21 ENCOUNTER — Other Ambulatory Visit: Payer: Self-pay

## 2019-07-21 VITALS — BP 119/66 | HR 80 | Temp 98.4°F | Ht 64.0 in | Wt 277.0 lb

## 2019-07-21 DIAGNOSIS — E8881 Metabolic syndrome: Secondary | ICD-10-CM

## 2019-07-21 DIAGNOSIS — E559 Vitamin D deficiency, unspecified: Secondary | ICD-10-CM

## 2019-07-21 DIAGNOSIS — Z6841 Body Mass Index (BMI) 40.0 and over, adult: Secondary | ICD-10-CM | POA: Diagnosis not present

## 2019-07-21 DIAGNOSIS — F3289 Other specified depressive episodes: Secondary | ICD-10-CM

## 2019-07-21 MED ORDER — VITAMIN D (ERGOCALCIFEROL) 1.25 MG (50000 UNIT) PO CAPS: 50000.0000 [IU] | ORAL_CAPSULE | ORAL | 0 refills | Status: DC

## 2019-07-21 NOTE — Progress Notes (Signed)
Office: [phone]  /  Fax: [phone]   HPI:   Chief Complaint: OBESITY Yolanda Chavez is here to discuss her progress with her obesity treatment plan. She is on the Category 3 plan and is following her eating plan approximately 10% of the time. She states she is exercising 0 minutes 0 times per week. Yolanda Chavez is down 17 lbs from her last visit in-house in March 2020. She is tired of dieting at this time and misses chocolate cookies, and potatoes.  Her weight is 277 lb (125.6 kg) today and has had a weight loss of 17 pounds over a period of 6 months since her last in-office visit. She has lost 17 lbs since starting treatment with [name].  Vitamin D deficiency Yolanda Chavez has a diagnosis of Vitamin D deficiency. Last Vitamin D 10.8 on 12/31/2018. She is currently taking prescription Vit D and denies nausea, vomiting or muscle weakness.  Insulin Resistance Yolanda Chavez has a diagnosis of insulin resistance based on her elevated fasting insulin level >5. Last A1c 5.6 on 12/31/2018 with an insulin of 12.7. Although Yolanda Chavez's blood glucose readings are still under good control, insulin resistance puts her at greater risk of metabolic syndrome and diabetes. She is taking metformin currently and continues to work on diet and exercise to decrease risk of diabetes.  Depression with emotional eating behaviors Yolanda Chavez is struggling with emotional eating and using food for comfort to the extent that it is negatively impacting her health. She often snacks when she is not hungry. Yolanda Chavez sometimes feels she is out of control and then feels guilty that she made poor food choices. She has been working on behavior modification techniques to help reduce her emotional eating and has been somewhat successful. Yolanda Chavez states that she is not ready to work on her cravings at this time. She is taking Topamax. She shows no sign of suicidal or homicidal ideations.  Depression screen [facility] 2/9 12/31/2018  Decreased Interest 3  Down, Depressed,  Hopeless 1  PHQ - 2 Score 4  Altered sleeping 0  Tired, decreased energy 3  Change in appetite 2  Feeling bad or failure about yourself  0  Trouble concentrating 3  Moving slowly or fidgety/restless 0  Suicidal thoughts 0  PHQ-9 Score 12  Difficult doing work/chores Not difficult at all   ASSESSMENT AND PLAN:  Vitamin D deficiency - Plan: Vitamin D, Ergocalciferol, (DRISDOL) 1.25 MG (50000 UT) CAPS capsule  Insulin resistance  Other depression  Class 3 severe obesity with serious comorbidity and body mass index (BMI) of 45.0 to 49.9 in adult, unspecified obesity type ([facility])  PLAN:  Vitamin D Deficiency Yolanda Chavez was informed that low Vitamin D levels contributes to fatigue and are associated with obesity, breast, and colon cancer. She agrees to continue to take prescription Vit D @ 50,000 IU every week #4 with 0 refills and will follow-up for routine testing of Vitamin D, at least 2-3 times per year. She was informed of the risk of over-replacement of Vitamin D and agrees to not increase her dose unless she discusses this with [name] first. Claribel agrees to follow-up with our clinic in 2 weeks.  Insulin Resistance Yolanda Chavez will continue to work on weight loss, exercise, and decreasing simple carbohydrates in her diet to help decrease the risk of diabetes. We dicussed metformin including benefits and risks. She was informed that eating too many simple carbohydrates or too many calories at one sitting increases the likelihood of GI side effects. Yolanda Chavez will continue metformin and follow-up  with [name] as directed to monitor her progress.  Depression with Emotional Eating Behaviors We discussed behavior modification techniques today to help Yolanda Chavez deal with her emotional eating and depression. Yolanda Chavez will continue Topamax and she will be referred to Dr. Mallie Mussel, our bariatric psychologist, for evaluation.  Obesity Yolanda Chavez is currently in the action stage of change. As such, her goal is to continue  with weight loss efforts. She has agreed to follow the Category 3 plan. Yolanda Chavez will work on meal planning and intentional eating. Yolanda Chavez has been instructed to work up to a goal of 150 minutes of combined cardio and strengthening exercise per week for weight loss and overall health benefits. We discussed the following Behavioral Modification Strategies today: increasing lean protein intake, decreasing simple carbohydrates, increasing vegetables, increase H20 intake, decrease eating out, no skipping meals, work on meal planning and easy cooking plans, and keeping healthy foods in the home.  Yolanda Chavez has agreed to follow-up with our clinic in 2 weeks. She was informed of the importance of frequent follow-up visits to maximize her success with intensive lifestyle modifications for her multiple health conditions.  ALLERGIES: Allergies  Allergen Reactions  . Adhesive [Tape] Itching and Rash    MEDICATIONS: Current Outpatient Medications on File Prior to Visit  Medication Sig Dispense Refill  . acetaminophen (TYLENOL) 325 MG tablet Take 325 mg by mouth as needed.    . divalproex (DEPAKOTE ER) 500 MG 24 hr tablet Take 1,000 mg by mouth at bedtime.     . fluvoxaMINE (LUVOX) 100 MG tablet Take 100 mg by mouth 2 (two) times daily.     [name] gabapentin (NEURONTIN) 100 MG capsule Take 100-200 mg by mouth 2 (two) times daily. Take 100 mg every morning  Take 200 mg every night    . levocetirizine (XYZAL) 5 MG tablet Take 5 mg by mouth every evening.    [name] levothyroxine (SYNTHROID, LEVOTHROID) 50 MCG tablet Take 50 mcg by mouth daily before breakfast.     . metFORMIN (GLUCOPHAGE) 500 MG tablet Take 1 tablet (500 mg total) by mouth daily with lunch. 30 tablet 0  . montelukast (SINGULAIR) 10 MG tablet Take 10 mg by mouth at bedtime.    . ondansetron (ZOFRAN ODT) 4 MG disintegrating tablet Take 1 tablet (4 mg total) by mouth every 8 (eight) hours as needed for nausea or vomiting. 10 tablet 0  . oxyCODONE (OXY  IR/ROXICODONE) 5 MG immediate release tablet Take 5 mg by mouth every 6 (six) hours as needed for moderate pain.     . promethazine (PHENERGAN) 25 MG tablet Take 25 mg by mouth every 6 (six) hours as needed for nausea or vomiting.    [name] QUEtiapine (SEROQUEL) 100 MG tablet Take 200 mg by mouth at bedtime.     . sucralfate (CARAFATE) 1 g tablet Take 1 g by mouth 4 (four) times daily -  with meals and at bedtime.    . topiramate (TOPAMAX) 50 MG tablet Take 1 tablet (50 mg total) by mouth daily. 30 tablet 0  . VENTOLIN HFA 108 (90 Base) MCG/ACT inhaler   0   No current facility-administered medications on file prior to visit.     PAST MEDICAL HISTORY: Past Medical History:  Diagnosis Date  . Anemia   . Anxiety   . Back pain   . Bipolar 1 disorder ([place])   . Cancer ([place])   . Depression   . Edema, lower extremity   . Gallbladder problem   . GERD (  gastroesophageal reflux disease)   . 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 History of blood transfusion   . Hypothyroidism   . Sleep apnea   . Umbilical hernia     PAST SURGICAL HISTORY: Past Surgical History:  Procedure Laterality Date  . 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 BREAST CYST EXCISION Right   . 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 gall stones    . HERNIA REPAIR  07/2011   uh with PVP, complicated by infection, mesh removal and primary repair of hernia  . JOINT REPLACEMENT     bilateral knee  . latareces    . MELANOMA EXCISION  may 2012   cancerous cells removed from  left side of nose   . thumb surgery  1977  . TUBAL LIGATION      SOCIAL HISTORY: Social History   Tobacco Use  . Smoking status: Never Smoker  . Smokeless tobacco: Never Used  Substance Use Topics  . Alcohol use: No  . Drug use: No    FAMILY HISTORY: Family History  Problem Relation Age of Onset  . Heart disease Mother        arrythmia  . Cancer Mother   . Anxiety disorder Mother   . Cancer Father        leukemia  . Depression  Father   . Diabetes Father   . High blood pressure Father    ROS: Review of Systems  Gastrointestinal: Negative for nausea and vomiting.  Musculoskeletal:       Negative for muscle weakness.  Psychiatric/Behavioral: Positive for depression (emotional eating). Negative for suicidal ideas.       Negative for homicidal ideas.   PHYSICAL EXAM: Blood pressure 119/66, pulse 80, temperature 98.4 F (36.9 C), temperature source Oral, height 5\' 4"  (1.626 m), weight 277 lb (125.6 kg), SpO2 97 %. Body mass index is 47.55 kg/m. Physical Exam Vitals signs reviewed.  Constitutional:      Appearance: Normal appearance. She is obese.  Cardiovascular:     Rate and Rhythm: Normal rate.     Pulses: Normal pulses.  Pulmonary:     Effort: Pulmonary effort is normal.     Breath sounds: Normal breath sounds.  Musculoskeletal: Normal range of motion.  Skin:    General: Skin is warm and dry.  Neurological:     Mental Status: She is alert and oriented to person, place, and time.  Psychiatric:        Behavior: Behavior normal.   RECENT LABS AND TESTS: BMET    Component Value Date/Time   NA 143 12/31/2018 1410   K 4.0 12/31/2018 1410   CL 98 12/31/2018 1410   CO2 27 12/31/2018 1410   GLUCOSE 121 (H) 12/31/2018 1410   GLUCOSE 133 (H) 03/01/2016 1450   BUN 11 12/31/2018 1410   CREATININE 0.65 12/31/2018 1410   CREATININE 0.72 08/21/2011 1723   CALCIUM 9.7 12/31/2018 1410   GFRNONAA 91 12/31/2018 1410   GFRAA 105 12/31/2018 1410   Lab Results  Component Value Date   HGBA1C 5.6 12/31/2018   Lab Results  Component Value Date   INSULIN 12.7 12/31/2018   CBC    Component Value Date/Time   WBC 6.1 03/01/2016 1450   RBC 4.69 03/01/2016 1450   HGB 14.3 03/01/2016 1450   HCT 42.9 03/01/2016 1450   PLT 206 03/01/2016 1450   MCV 91.5 03/01/2016 1450   MCH 30.5 03/01/2016 1450  MCHC 33.3 03/01/2016 1450   RDW 14.0 03/01/2016 1450   LYMPHSABS 2.3 08/21/2011 1723   MONOABS 0.5  08/21/2011 1723   EOSABS 0.2 08/21/2011 1723   BASOSABS 0.1 08/21/2011 1723   Iron/TIBC/Ferritin/ %Sat No results found for: IRON, TIBC, FERRITIN, IRONPCTSAT Lipid Panel  No results found for: CHOL, TRIG, HDL, CHOLHDL, VLDL, LDLCALC, LDLDIRECT Hepatic Function Panel     Component Value Date/Time   PROT 6.7 12/31/2018 1410   ALBUMIN 4.3 12/31/2018 1410   AST 18 12/31/2018 1410   ALT 25 12/31/2018 1410   ALKPHOS 86 12/31/2018 1410   BILITOT 0.5 12/31/2018 1410   No results found for: TSH  Results for TAYSHIA, COPPES (MRN [id number]) as of 07/21/2019 14:02  Ref. Range 12/31/2018 14:10  Vitamin D, 25-Hydroxy Latest Ref Range: 30.0 - 100.0 ng/mL 10.8 (L)   OBESITY BEHAVIORAL INTERVENTION VISIT  Today's visit was #15  Starting weight: 294 lbs Starting date: 12/31/2018 Today's weight: 277 lbs  Today's date: 07/21/2019 Total lbs lost to date: 17 At least 15 minutes were spent on discussing the following behavioral intervention visit.    07/21/2019  Height 5\' 4"  (1.626 m)  Weight 277 lb (125.6 kg)  BMI (Calculated) 47.52  BLOOD PRESSURE - SYSTOLIC [id number]  BLOOD PRESSURE - DIASTOLIC 66  Waist Measurement  57.3 inches   ASK: We discussed the diagnosis of obesity with Binnie Kand Robb today and Aixa agreed to give [name] permission to discuss obesity behavioral modification therapy today.  ASSESS: Yajaira has the diagnosis of obesity and her BMI today is 47.6. Uzma is in the action stage of change.   ADVISE: Jevaeh was educated on the multiple health risks of obesity as well as the benefit of weight loss to improve her health. She was advised of the need for long term treatment and the importance of lifestyle modifications to improve her current health and to decrease her risk of future health problems.  AGREE: Multiple dietary modification options and treatment options were discussed and  Shannan agreed to follow the recommendations documented in the above note.   ARRANGE: Mesa was educated on the importance of frequent visits to treat obesity as outlined per CMS and USPSTF guidelines and agreed to schedule her next follow up appointment today.  Migdalia Dk, am acting as Location manager for [employer], DO  I have reviewed the above documentation for accuracy and completeness, and I agree with the above. -Jearld Lesch, DO

## 2019-07-27 ENCOUNTER — Other Ambulatory Visit (INDEPENDENT_AMBULATORY_CARE_PROVIDER_SITE_OTHER): Payer: Self-pay | Admitting: Bariatrics

## 2019-07-29 NOTE — Progress Notes (Unsigned)
Office: [phone]  /  Fax: [phone]    Date: August 04, 2019   Appointment Start Time:*** Duration:*** Provider: Glennie Isle, Psy.D. Type of Session: Intake for Individual Therapy  Location of Patient: *** Location of Provider: {Location of Service:224[address]} Type of Contact: Telepsychological Visit via [employer] Web[employer]  Informed Consent: Prior to proceeding with today's appointment, two pieces of identifying information were obtained from Yolanda Chavez to verify identity. In addition, Yolanda Chavez's physical location at the time of this appointment was obtained. Yolanda Chavez reported she was at *** and provided the address. In the event of technical difficulties, Yolanda Chavez shared a phone number she could be reached at. Yolanda Chavez and this provider participated in today's telepsychological service. Also, Yolanda Chavez denied anyone else being present in the room or on the WebEx appointment***.   The provider's role was explained to Yolanda Chavez. The provider reviewed and discussed issues of confidentiality, privacy, and limits therein (e.g., reporting obligations). In addition to verbal informed consent, written informed consent for psychological services was obtained from Yolanda Chavez prior to the initial intake interview. Written consent included information concerning the practice, financial arrangements, and confidentiality and patients' rights. Since the clinic is not a 24/7 crisis center, mental health emergency resources were shared, and the provider explained MyChart, e-mail, voicemail, and/or other messaging systems should be utilized only for non-emergency reasons. This provider also explained that information obtained during appointments will be placed in Yolanda Chavez's medical record in a confidential manner and relevant information will be shared with other providers at [facility] that she meets with for coordination of care. Yolanda Chavez verbally acknowledged understanding of the aforementioned, and agreed to  use mental health emergency resources discussed if needed. Moreover, Yolanda Chavez agreed information may be shared with other [facility] providers as needed for coordination of care. By signing the service agreement document, Yolanda Chavez provided written consent for coordination of care.   Prior to initiating telepsychological services, Yolanda Chavez was provided with an informed consent document, which included the development of a safety plan (i.e., an emergency contact and emergency resources) in the event of an emergency/crisis. Yolanda Chavez expressed understanding of the rationale of the safety plan and provided consent for this provider to reach out to her emergency contact in the event of an emergency/crisis. Yolanda Chavez returned the completed consent form prior to today's appointment. This provider verbally reviewed the consent form during today's appointment prior to proceeding with the appointment. Yolanda Chavez verbally acknowledged understanding that she is ultimately responsible for understanding her insurance benefits as it relates to reimbursement of telepsychological and in-person services. This provider also reviewed confidentiality, as it relates to telepsychological services, as well as the rationale for telepsychological services. More specifically, this provider's clinic is providing telepsychological services as an option for appointments to reduce exposure to COVID-19. Yolanda Chavez expressed understanding regarding the rationale for telepsychological services. In addition, this provider explained the telepsychological services informed consent document would be considered an addendum to the initial consent document/service agreement. Yolanda Chavez verbally consented to proceed.   Chief Complaint/HPI: Yolanda Chavez was referred by Dr. Jearld Lesch due to depression with emotional eating behaviors. Per the note for the visit with Dr. Jearld Lesch on July 21, 2019, "Yolanda Chavez is struggling with emotional eating and using food for  comfort to the extent that it is negatively impacting her health. She often snacks when she is not hungry. Yolanda Chavez sometimes feels she is out of control and then feels guilty that she made poor food choices. She has been working on behavior modification  techniques to help reduce her emotional eating and has been somewhat successful. Yolanda Chavez states that she is not ready to work on her cravings at this time. She is taking Topamax. She shows no sign of suicidal or homicidal ideations." During the initial appointment with Dr. Jearld Lesch at [facility] on December 31, 2018, Yolanda Chavez reported experiencing the following: significant food cravings issues , snacking frequently in the evenings, frequently drinking liquids with calories, frequently making poor food choices, frequently eating larger portions than normal , binge eating behaviors, struggling with emotional eating, skipping meals frequently and having problems with excessive hunger.   During today's appointment, Yolanda Chavez was verbally administered a questionnaire assessing various behaviors related to emotional eating. Yolanda Chavez endorsed the following: {gbmoodandfood:21755}. She shared she craves ***. Yolanda Chavez believes the onset of emotional eating was *** and described the current frequency of emotional eating as ***. In addition, Yolanda Chavez {gblegal:22371} a history of binge eating. *** Moreover, Yolanda Chavez indicated *** triggers emotional eating, whereas *** makes emotional eating better. Furthermore, Yolanda Chavez {gblegal:22371} other problems of concern. ***   Mental Status Examination:  Appearance: {Appearance:22431} Behavior: {Behavior:22445} Mood: {gbmood:21757} Affect: {Affect:22436} Speech: {Speech:22432} Eye Contact: {Eye Contact:22433} Psychomotor Activity: {Motor Activity:22434} Thought Process: {thought process:22448}  Content/Perceptual Disturbances: {disturbances:22451} Orientation: {Orientation:22437} Cognition/Sensorium:  {gbcognition:22449} Insight: {Insight:22446} Judgment: {Insight:22446}  Family & Psychosocial History: Yolanda Chavez reported she is ***. She indicated she is currently ***. Additionally, Yolanda Chavez shared her highest level of education obtained is ***. Currently, Yolanda Chavez's social support system consists of ***. Moreover, Yolanda Chavez stated she resides with ***.   Medical History: ***  Mental Health History: Nikiyah {gblegal:22371} a history of therapeutic services. Yolanda Chavez denied a history of hospitalizations for psychiatric concerns, and has never met with a psychiatrist.*** Yolanda Chavez stated she was *** psychotropic medications. Yolanda Chavez {gblegal:22371} a family history of mental health related concerns. *** Leniyah denied a trauma history, including {gbtrauma:22071} abuse, as well as neglect. Yolanda Chavez described her typical mood as ***. Aside from concerns noted above and endorsed on the PHQ-9 and GAD-7, Lilyona reported ***. Lezlee {gblegal:22371} current alcohol use. *** She {gblegal:22371} tobacco use. *** She {SHUOHFG:90211} illicit/recreational substance use. Regarding caffeine intake, Emmilia reported ***. Furthermore, Katherina denied experiencing the following: {gbsxs:21965}. She also denied history of and current suicidal ideation, plan, and intent; history of and current homicidal ideation, plan, and intent; and history of and current engagement in self-harm.  The following strengths were reported by Yolanda Chavez:*** The following strengths were observed by this provider: {gbstrengths:22223}.  Legal History: Donata {gblegal:22371} a history of legal involvement.   Structured Assessment Results: The Patient Health Questionnaire-9 (PHQ-9) is a self-report measure that assesses symptoms and severity of depression over the course of the last two weeks. Margene obtained a score of *** suggesting {GBPHQ9SEVERITY:21752}. Annaliyah finds the endorsed symptoms to be {gbphq9difficulty:21754}. Little interest or pleasure in doing  things ***  Feeling down, depressed, or hopeless ***  Trouble falling or staying asleep, or sleeping too much ***  Feeling tired or having little energy ***  Poor appetite or overeating ***  Feeling bad about yourself --- or that you are a failure or have let yourself or your family down ***  Trouble concentrating on things, such as reading the newspaper or watching television ***  Moving or speaking so slowly that other people could have noticed? Or the opposite --- being so fidgety or restless that you have been moving around a lot more than usual ***  Thoughts that you would be better off dead  or hurting yourself in some way ***  PHQ-9 Score ***    The Generalized Anxiety Disorder-7 (GAD-7) is a brief self-report measure that assesses symptoms of anxiety over the course of the last two weeks. Jennah obtained a score of *** suggesting {gbgad7severity:21753}. Kyung finds the endorsed symptoms to be {gbphq9difficulty:21754}. Feeling nervous, anxious, on edge ***  Not being able to stop or control worrying ***  Worrying too much about different things ***  Trouble relaxing ***  Being so restless that it's hard to sit still ***  Becoming easily annoyed or irritable ***  Feeling afraid as if something awful might happen ***  GAD-7 Score ***   Interventions: A chart review was conducted prior to the clinical intake interview. The PHQ-9, and GAD-7 were verbally administered as well as a Mood and Food questionnaire to assess various behaviors related to emotional eating. Throughout session, empathic reflections and validation was provided. Continuing treatment with this provider was discussed and a treatment goal was established. Psychoeducation regarding emotional versus physical hunger was provided. Brianne was sent a handout via e-mail to utilize between now and the next appointment to increase awareness of hunger patterns and subsequent eating. Carson provided verbal consent during today's  appointment for this provider to send the handout via e-mail. ***  Provisional DSM-5 Diagnosis: {Diagnoses:22752}  Plan: Leanah appears able and willing to participate as evidenced by collaboration on a treatment goal, engagement in reciprocal conversation, and asking questions as needed for clarification. The next appointment will be scheduled in {gbweeks:21758}, which will be via [employer]. The following treatment goal was established: {gbtxgoals:21759}. For the aforementioned goal, Tarini can benefit from biweekly individual therapy sessions that are brief in duration for approximately four to six sessions. The treatment modality will be individual therapeutic services, including an eclectic therapeutic approach utilizing techniques from Cognitive Behavioral Therapy, Patient Centered Therapy, Dialectical Behavior Therapy, Acceptance and Commitment Therapy, Interpersonal Therapy, and Cognitive Restructuring. Therapeutic approach will include various interventions as appropriate, such as validation, support, mindfulness, thought defusion, reframing, psychoeducation, values assessment, and role playing. This provider will regularly review the treatment plan and medical chart to keep informed of status changes. Garima expressed understanding and agreement with the initial treatment plan of care.

## 2019-08-04 ENCOUNTER — Ambulatory Visit (INDEPENDENT_AMBULATORY_CARE_PROVIDER_SITE_OTHER): Payer: Medicare Other | Admitting: Psychology

## 2019-08-04 ENCOUNTER — Encounter (INDEPENDENT_AMBULATORY_CARE_PROVIDER_SITE_OTHER): Payer: Self-pay | Admitting: Bariatrics

## 2019-08-04 ENCOUNTER — Ambulatory Visit (INDEPENDENT_AMBULATORY_CARE_PROVIDER_SITE_OTHER): Payer: Medicare Other | Admitting: Bariatrics

## 2019-08-04 ENCOUNTER — Other Ambulatory Visit: Payer: Self-pay

## 2019-08-04 VITALS — BP 118/73 | HR 83 | Temp 98.0°F | Ht 64.0 in | Wt 272.0 lb

## 2019-08-04 DIAGNOSIS — E8881 Metabolic syndrome: Secondary | ICD-10-CM

## 2019-08-04 DIAGNOSIS — Z6841 Body Mass Index (BMI) 40.0 and over, adult: Secondary | ICD-10-CM

## 2019-08-04 DIAGNOSIS — F3289 Other specified depressive episodes: Secondary | ICD-10-CM

## 2019-08-04 DIAGNOSIS — E559 Vitamin D deficiency, unspecified: Secondary | ICD-10-CM | POA: Diagnosis not present

## 2019-08-04 NOTE — Progress Notes (Signed)
Office: [phone]  /  Fax: [phone]    Date: August 18, 2019  Time Seen: 8:58am Duration: 52 minutes Provider: Glennie Isle, PsyD Type of Session: Intake for Individual Therapy  Type of Contact: Face-to-face  Informed Consent for In-Person Services During COVID-19: During today's appointment, information about the decision to initiate in-person services in light of the LDJTT-01 public health crisis was discussed. Hassan Rowan and this provider agreed to meet in person for some or all future appointments. If there is a resurgence of the pandemic or other health concerns arise, telepsychological services may be initiated and any related concerns will be discussed and an attempt to address them will be made. Elizaveta verbally acknowledged understanding that if necessary, this provider may determine there is a need to initiate telepsychological services for everyone's well-being. Carnella expressed understanding she may request to initiate telepsychological services, and that request will be respected as long as it is feasible and clinically appropriate. Regarding telepsychological services, Ariea acknowledged she is ultimately responsible for understanding her insurance benefits as it relates to reimbursement of telepsychological services. Moreover, the risks for opting for in-person services was discussed. Alitza verbally acknowledged understanding that by coming to the office, she is assuming the risk of exposure to the coronavirus or other public risk, and the risk may increase if Aine travels by public transportation, cab, or [employer]. To obtain in-person services, Amen verbally agreed to taking certain precautions (e.g., screening prior to appointment; universal masking; social distancing of 6 feet; proper hand hygiene; no visitors) set forth by [facility] to keep everyone safe from exposure, sickness, and possible death. This information was shared by front desk staff either at the  time of scheduling and/or during the check-in process. Tryphena expressed understanding that should she not adhere to these safeguards, it may result in starting/returning to a telepsychological service arrangement and/or the exploration of other options for treatment. Nikyah acknowledged understanding that [facility] will follow the protocol set forth by [facility] should a patient present with a fever or other symptoms or disclose recent exposure, which will include rescheduling the appointment. Furthermore, Daijha acknowledged understanding that precautions may change if additional local, state or federal orders or guidelines are published. This provider also shared that if Raelie tests positive for the coronavirus, this provider may be required to notify local health authorities that Cherisse was in the [facility] clinic. Only minimum information necessary for data collection will be disclosed. This provider will follow [facility]'s disclosure policy should this provider or staff test positive for the coronavirus. To avoid handling of paper/writing instruments and increasing likelihood of touching, verbal consent was obtained by Hassan Rowan during today's appointment prior to proceeding. Nashira provided verbal consent to proceed, and acknowledged understanding that by verbally consenting to proceed, she is agreeable to all information noted above.   Informed Consent: The provider's role was explained to [employer]. The provider reviewed and discussed issues of confidentiality, privacy, and limits therein (e.g., reporting obligations). In addition to verbal informed consent, written informed consent for psychological services was obtained from [place] prior to the initial intake interview. Written consent included information concerning the practice, financial arrangements, and confidentiality and patients' rights. Since the clinic is not a 24/7 crisis center, mental health  emergency resources were shared, and the provider explained MyChart, e-mail, voicemail, and/or other messaging systems should be utilized only for non-emergency reasons. This provider also explained that information obtained during appointments will be placed in Loistine's  medical record in a confidential manner and relevant information will be shared with other providers at [facility] that she meets with for coordination of care. Dedee verbally acknowledged understanding of the aforementioned, and agreed to use mental health emergency resources discussed if needed. Moreover, Adisyn agreed information may be shared with other [facility] providers as needed for coordination of care. By signing the service agreement document, Lacrystal provided written consent for coordination of care.   Chief Complaint/HPI: Simra was referred by Dr. Jearld Lesch due to depression with emotional eating behaviors. Per the note for the visit with Dr. Jearld Lesch on July 21, 2019, "Sharla is struggling with emotional eating and using food for comfort to the extent that it is negatively impacting her health. She often snacks when she is not hungry. Rayni sometimes feels she is out of control and then feels guilty that she made poor food choices. She has been working on behavior modification techniques to help reduce her emotional eating and has been somewhat successful. Amayia states that she is not ready to work on her cravings at this time. She is taking Topamax. She shows no sign of suicidal or homicidal ideations." During the initial appointment with Dr. Jearld Lesch at [facility] on December 31, 2018, Leyanna reported experiencing the following: significant food cravings issues , snacking frequently in the evenings, frequently drinking liquids with calories, frequently making poor food choices, frequently eating larger portions than normal , binge eating behaviors, struggling with emotional  eating, skipping meals frequently and having problems with excessive hunger.   During today's appointment, Cecely was verbally administered a questionnaire assessing various behaviors related to emotional eating. Delcie endorsed the following: overeat when you are celebrating, experience food cravings on a regular basis, eat certain foods when you are anxious, stressed, depressed, or your feelings are hurt, use food to help you cope with emotional situations, find food is comforting to you, overeat when you are angry or upset, overeat when you are worried about something, overeat frequently when you are bored or lonely, not worry about what you eat when you are in a good mood, overeat when you are angry at someone just to show them they cannot control you, overeat when you are alone, but eat much less when you are with other people, eat to help you stay awake and eat as a reward. She shared she craves chocolate and salty foods, such as white cheddar popcorn and chips. Kodi believes the onset of emotional eating was likely during her teenage years. She explained her mother would often say "you don't need that" or "you're going to fat" during Zaryiah's teenage years. She also recalled her mother would call her sister a "tank" as she gained weight. She described the current frequency of emotional eating as "every night." In addition, Shron endorsed a history of binge eating. This was further explored. Adalei shared when her husband went to sleep last night, she ate five string cheese sticks. She described the aforementioned as a typical binge and described the frequency as "just about every night in the last two weeks." She also shared she has been consuming Halloween candy. Jolan denied a history of restricting food intake, purging and engagement in other compensatory strategies, and has never been diagnosed with an eating disorder. She also denied a history of treatment for emotional eating. Moreover, Elenor  indicated sitting down and watching TV and being alone triggers emotional eating, whereas staying busy or shopping online  for her grandchildren makes emotional eating better. Furthermore, Eleena endorsed other problems of concern. Toshie shared while she was at work she would have to plan upcoming holidays, specifically the food. She explained she would eat "a little" to avoid being judged.    Mental Status Examination:  Appearance: neat Behavior: cooperative Mood: euthymic Affect: mood congruent Speech: normal in rate, volume, and tone Eye Contact: appropriate Psychomotor Activity: appropriate Thought Process: linear, logical, and goal directed  Content/Perceptual Disturbances: denies suicidal and homicidal ideation, plan, and intent and no hallucinations, delusions, bizarre thinking or behavior reported or observed Orientation: time, person, place and purpose of appointment Cognition/Sensorium: memory, attention, language, and fund of knowledge intact  Insight: good Judgment: good  Family & Psychosocial History: Sybol reported she is married and she has two adult children (ages 6 and 49). Lael shared she has four grandchildren. She indicated she is currently retired. She noted, "I've been retired since 1995. I had a nervous breakdown in '95." Additionally, Maleeya shared her highest level of education obtained is a high school diploma. Currently, Tashona's social support system consists of her husband, cousin Vaughan Basta) in [place], and sons. Moreover, Icelyn stated she resides with her husband. Desarie shared she moved from [place] to [place] in 2002, which she noted she did not want to do.   Medical History:  Past Medical History:  Diagnosis Date   Anemia    Anxiety    Back pain    Bipolar 1 disorder (HCC)    Cancer (HCC)    Depression    Edema, lower extremity    Gallbladder problem    GERD (gastroesophageal reflux disease)    Hemolytic anemia (HCC)    History of  blood transfusion    Hypothyroidism    Sleep apnea    Umbilical hernia    Past Surgical History:  Procedure Laterality Date   2 total knee replacements  2009 &2010   BREAST CYST EXCISION Right    BREAST SURGERY     fobrocyst tumor on right breast    gall stones     HERNIA REPAIR  07/2011   uh with PVP, complicated by infection, mesh removal and primary repair of hernia   JOINT REPLACEMENT     bilateral knee   latareces     MELANOMA EXCISION  may 2012   cancerous cells removed from  left side of nose    thumb surgery  1977   TUBAL LIGATION     Current Outpatient Medications on File Prior to Visit  Medication Sig Dispense Refill   acetaminophen (TYLENOL) 325 MG tablet Take 325 mg by mouth as needed.     divalproex (DEPAKOTE ER) 500 MG 24 hr tablet Take 1,000 mg by mouth at bedtime.      fluvoxaMINE (LUVOX) 100 MG tablet Take 100 mg by mouth 2 (two) times daily.      gabapentin (NEURONTIN) 100 MG capsule Take 100-200 mg by mouth 2 (two) times daily. Take 100 mg every morning  Take 200 mg every night     levocetirizine (XYZAL) 5 MG tablet Take 5 mg by mouth every evening.     levothyroxine (SYNTHROID, LEVOTHROID) 50 MCG tablet Take 50 mcg by mouth daily before breakfast.      metFORMIN (GLUCOPHAGE) 500 MG tablet Take 1 tablet (500 mg total) by mouth daily with lunch. 30 tablet 0   montelukast (SINGULAIR) 10 MG tablet Take 10 mg by mouth at bedtime.     ondansetron (ZOFRAN ODT) 4  MG disintegrating tablet Take 1 tablet (4 mg total) by mouth every 8 (eight) hours as needed for nausea or vomiting. 10 tablet 0   oxyCODONE (OXY IR/ROXICODONE) 5 MG immediate release tablet Take 5 mg by mouth every 6 (six) hours as needed for moderate pain.      promethazine (PHENERGAN) 25 MG tablet Take 25 mg by mouth every 6 (six) hours as needed for nausea or vomiting.     QUEtiapine (SEROQUEL) 100 MG tablet Take 200 mg by mouth at bedtime.      sucralfate (CARAFATE) 1 g tablet  Take 1 g by mouth 4 (four) times daily -  with meals and at bedtime.     topiramate (TOPAMAX) 50 MG tablet Take 1 tablet (50 mg total) by mouth daily. 30 tablet 0   VENTOLIN HFA 108 (90 Base) MCG/ACT inhaler   0   Vitamin D, Ergocalciferol, (DRISDOL) 1.25 MG (50000 UT) CAPS capsule Take 1 capsule (50,000 Units total) by mouth every 7 (seven) days. 4 capsule 0   No current facility-administered medications on file prior to visit.   Rashae denied a history of head injuries and loss of consciousness.   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 Terika explained she "started crying emotionally, uncontrollably" and experienced panic attacks. She noted she first experienced a panic attack at [facility] around that time, and noted she last experienced a panic attack "within the last two weeks." She indicated she also previously met with another therapist after to discuss her mother "making [her] feel guilty," which was a couple years ago. She was unsure regarding the timeline for therapeutic services, but discussed attending services on and off since 1988 with the last time being a couple years ago. Holly shared she currently has a psychiatrist, Janeth Rase, M.D with [facility] in [place]. She reported she initiated services with Dr. Constance Haw in 2002. She meets with Dr. Constance Haw every three months for medication management, and their next appointment is on September 14, 2019. Currently, Dr. Constance Haw prescribes Depakote, Luvox, Seroquel, and Gabapentin, which she described as helpful. She stated she is diagnosed with bipolar disorder. Kameka endorsed a family history of mental health related concerns. She reported, "I think my dad might have had panic attacks, but didn't  realize it." Elnita denied a trauma history, including psychological, physical  and sexual abuse, as well as neglect.   Yamaira described her typical mood as "very good." Aside from concerns noted above and endorsed on the PHQ-9 and GAD-7, Liza reported experiencing decreased motivation and worry thoughts about finances during the holidays and her meal plan. While she denied experiencing irritability, expansive mood, excessive spending, engagement in risky behaviors, and a decreased need for sleep, Sukaina described experiencing flighty thoughts. Kinzley denied current alcohol use. She denied tobacco use. She denied illicit/recreational substance use. Regarding caffeine intake, Gao reported consuming coffee "one every 6 months." Furthermore, Nasrin denied experiencing the following: hopelessness, memory concerns, irritability, hallucinations and delusions, paranoia, social withdrawal and crying spells. She also denied current suicidal ideation, plan, and intent; history of and current homicidal ideation, plan, and intent; and history of and current engagement in self-harm.  Around 2005-2006, Inetha experienced suicidal ideation. She reported a minister came to see her to "help her through." She reported since establishing a  medication regimen, she has not experienced suicidal ideation. Shenae stated around 2005-2006 was the first time she experienced suicidal ideation and that was the last. She denied a history of suicidal plan and intent. Tiena denied a history of suicidal gestures and attempts. The following protective factors were identified for Shakeia: grandchildren and dog Gwynne Edinger). If she were to become overwhelmed in the future, which is a sign that a crisis may occur, she identified the following coping skills she could engage in: go see daughter in law to spend time with granddaughter (Autumn), spend time with dog, read, talk to husband, and talk to cousin. It was recommended the aforementioned be  written down and developed into a coping card for future reference; she was observed writing. Psychoeducation regarding the importance of reaching out to a trusted individual and/or utilizing emergency resources if there is a change in emotional status and/or there is an inability to ensure safety was provided. Danne's confidence in reaching out to a trusted individual and/or utilizing emergency resources should there be an intensification in emotional status and/or there is an inability to ensure safety was assessed on a scale of one to ten where one is not confident and ten is extremely confident. She reported her confidence is a 10. Additionally, Lamira endorsed current access to firearms and/or weapons. She noted her husband has a pistol, shot gun, and rifle that are locked. Keysha noted she does not know the combination and she added, "I don't know where they are and I don't know how to shoot them."  The following strengths were reported by Hassan Rowan: caring, love animals, and good grandmother. The following strengths were observed by this provider: ability to express thoughts and feelings during the therapeutic session, ability to establish and benefit from a therapeutic relationship, ability to learn and practice coping skills, willingness to work toward established goal(s) with the clinic and ability to engage in reciprocal conversation.  Legal History: Hermina denied a history of legal involvement.   Structured Assessment Results: The Patient Health Questionnaire-9 (PHQ-9) is a self-report measure that assesses symptoms and severity of depression over the course of the last two weeks. Lolamae obtained a score of 6 suggesting mild depression. Johnnae finds the endorsed symptoms to be somewhat difficult. Little interest or pleasure in doing things 0  Feeling down, depressed, or hopeless 0  Trouble falling or staying asleep, or sleeping too much 1  Feeling tired or having little energy 0  Poor appetite  or overeating 3  Feeling bad about yourself --- or that you are a failure or have let yourself or your family down 2  Trouble concentrating on things, such as reading the newspaper or watching television 0  Moving or speaking so slowly that other people could have noticed? Or the opposite --- being so fidgety or restless that you have been moving around a lot more than usual 0  Thoughts that you would be better off dead or hurting yourself in some way 0  PHQ-9 Score 6    The Generalized Anxiety Disorder-7 (GAD-7) is a brief self-report measure that assesses symptoms of anxiety over the course of the last two weeks. Nemiah obtained a score of 6 suggesting mild anxiety. Cerra finds the endorsed symptoms to be very difficult. Feeling nervous, anxious, on edge-meal plan 3  Not being able to stop or control worrying 3  Worrying too much about different things 0  Trouble relaxing 0  Being so restless that it's hard to sit still 0  Becoming  easily annoyed or irritable 0  Feeling afraid as if something awful might happen 0  GAD-7 Score 6   Interventions: A chart review was conducted prior to the clinical intake interview. The PHQ-9, and GAD-7 were verbally administered and a clinical intake interview was completed. In addition, Giamarie was verbally administered a Mood and Food questionnaire to assess various behaviors related to emotional eating. Throughout session, empathic reflections and validation was provided. A risk assessment was completed and a coping card was developed. Continuing treatment with this provider was discussed and a treatment goal was established. Psychoeducation regarding emotional versus physical hunger was provided. Aqueelah was given a handout to utilize between now and the next appointment to increase awareness of hunger patterns and subsequent eating.   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 The next appointment will be scheduled in two weeks. The following treatment goal was established: decrease emotional eating. For the aforementioned goal, Vaniyah can benefit from individual therapy sessions that are brief in duration for approximately four to six sessions. The treatment modality will be individual therapeutic services, including an eclectic therapeutic approach utilizing techniques from Cognitive Behavioral Therapy, Patient Centered Therapy, Dialectical Behavior Therapy, Acceptance and Commitment Therapy, Interpersonal Therapy, and Cognitive Restructuring. Therapeutic approach will include various interventions as appropriate, such as validation, support, mindfulness, thought defusion, reframing, psychoeducation, values assessment, and role playing. This provider will regularly review the treatment plan and medical chart to keep informed of status changes. Eniyah expressed understanding and agreement with the initial treatment plan of care.

## 2019-08-05 LAB — COMPREHENSIVE METABOLIC PANEL
ALT: 17 IU/L (ref 0–32)
AST: 11 IU/L (ref 0–40)
Albumin/Globulin Ratio: 1.7 (ref 1.2–2.2)
Albumin: 4.4 g/dL (ref 3.8–4.8)
Alkaline Phosphatase: 86 IU/L (ref 39–117)
BUN/Creatinine Ratio: 22 (ref 12–28)
BUN: 18 mg/dL (ref 8–27)
Bilirubin Total: 0.4 mg/dL (ref 0.0–1.2)
CO2: 26 mmol/L (ref 20–29)
Calcium: 9.9 mg/dL (ref 8.7–10.3)
Chloride: 102 mmol/L (ref 96–106)
Creatinine, Ser: 0.81 mg/dL (ref 0.57–1.00)
GFR calc Af Amer: 86 mL/min/{1.73_m2} (ref >59)
GFR calc non Af Amer: 74 mL/min/{1.73_m2} (ref >59)
Globulin, Total: 2.6 g/dL (ref 1.5–4.5)
Glucose: 128 mg/dL — ABNORMAL HIGH (ref 65–99)
Potassium: 4.5 mmol/L (ref 3.5–5.2)
Sodium: 143 mmol/L (ref 134–144)
Total Protein: 7 g/dL (ref 6.0–8.5)

## 2019-08-05 LAB — INSULIN, RANDOM: INSULIN: 14.7 u[IU]/mL (ref 2.6–24.9)

## 2019-08-05 LAB — LIPID PANEL WITH LDL/HDL RATIO
Cholesterol, Total: 238 mg/dL — ABNORMAL HIGH (ref 100–199)
HDL: 50 mg/dL (ref >39)
LDL Chol Calc (NIH): 149 mg/dL — ABNORMAL HIGH (ref 0–99)
LDL/HDL Ratio: 3 ratio (ref 0.0–3.2)
Triglycerides: 216 mg/dL — ABNORMAL HIGH (ref 0–149)
VLDL Cholesterol Cal: 39 mg/dL (ref 5–40)

## 2019-08-05 LAB — HEMOGLOBIN A1C
Est. average glucose Bld gHb Est-mCnc: 114 mg/dL
Hgb A1c MFr Bld: 5.6 % (ref 4.8–5.6)

## 2019-08-05 LAB — VITAMIN D 25 HYDROXY (VIT D DEFICIENCY, FRACTURES): Vit D, 25-Hydroxy: 22.7 ng/mL — ABNORMAL LOW (ref 30.0–100.0)

## 2019-08-09 ENCOUNTER — Other Ambulatory Visit (INDEPENDENT_AMBULATORY_CARE_PROVIDER_SITE_OTHER): Payer: Self-pay | Admitting: Bariatrics

## 2019-08-09 DIAGNOSIS — F3289 Other specified depressive episodes: Secondary | ICD-10-CM

## 2019-08-10 NOTE — Progress Notes (Signed)
Office: [phone]  /  Fax: [phone]   HPI:   Chief Complaint: OBESITY Yolanda Chavez is here to discuss her progress with her obesity treatment plan. She is on the Category 3 plan and is following her eating plan approximately 5 % of the time. She states she is exercising 0 minutes 0 times per week. Yolanda Chavez is down 5 pounds and she is doing well overall. She is doing well with her water and protein. Her weight is 272 lb (123.4 kg) today and has had a weight loss of 5 pounds over a period of 2 weeks since her last visit. She has lost 22 lbs since starting treatment with [name].  Vitamin D deficiency Yolanda Chavez has a diagnosis of vitamin D deficiency. She is currently taking vit D and her last vitamin D level was at 10.8. Yolanda Chavez denies nausea, vomiting or muscle weakness.  Insulin Resistance Yolanda Chavez has a diagnosis of insulin resistance based on her elevated fasting insulin level >5. Although Yolanda Chavez's blood glucose readings are still under good control, insulin resistance puts her at greater risk of metabolic syndrome and diabetes. She is taking metformin currently and her last A1c was at 5.6 and last insulin level was at 12.7. She continues to work on diet and exercise to decrease risk of diabetes. Yolanda Chavez denies polyphagia.  Depression with emotional eating behaviors Yolanda Chavez struggles with emotional eating and using food for comfort to the extent that it is negatively impacting her health. She often snacks when she is not hungry. Yolanda Chavez feels she is out of control and then feels guilty that she made poor food choices. She has been working on behavior modification techniques to help reduce her emotional eating and has been somewhat successful. She will meet with Dr. Mallie Mussel. She shows no sign of suicidal or homicidal ideations.  ASSESSMENT AND PLAN:  Vitamin D deficiency - Plan: VITAMIN D 25 Hydroxy (Vit-D Deficiency, Fractures)  Insulin resistance - Plan: Comprehensive metabolic panel,  Hemoglobin A1c, Insulin, random, Lipid Panel With LDL/HDL Ratio  Other depression - with emotional eating   Class 3 severe obesity with serious comorbidity and body mass index (BMI) of 45.0 to 49.9 in adult, unspecified obesity type ([place])  PLAN:  Vitamin D Deficiency Yolanda Chavez was informed that low vitamin D levels contributes to fatigue and are associated with obesity, breast, and colon cancer. Yolanda Chavez will continue to take prescription Vit D @50 ,000 IU every week and she will follow up for routine testing of vitamin D, at least 2-3 times per year. She was informed of the risk of over-replacement of vitamin D and agrees to not increase her dose unless she discusses this with [name] first. We will check vitamin D level today and Yolanda Chavez will follow up as directed.  Insulin Resistance Yolanda Chavez will continue to work on weight loss, exercise, and decreasing simple carbohydrates in her diet to help decrease the risk of diabetes. She was informed that eating too many simple carbohydrates or too many calories at one sitting increases the likelihood of GI side effects. We will check A1c and insulin level today and [name] will continue to follow up with [name] as directed to monitor her progress.  Depression with Emotional Eating Behaviors We discussed behavior modification techniques today to help Yolanda Chavez deal with her emotional eating and depression. Yolanda Chavez will continue with Dr. Mallie Mussel and follow up as directed.  Obesity Yolanda Chavez is currently in the action stage of change. As such, her goal is to continue with weight loss efforts She has agreed  to follow the Category 3 plan Yolanda Chavez has been instructed to work up to a goal of 150 minutes of combined cardio and strengthening exercise per week for weight loss and overall health benefits. We discussed the following Behavioral Modification Strategies today: planning for success, increase H2O intake, no skipping meals, keeping healthy foods in the home, increasing lean  protein intake, decreasing simple carbohydrates, increasing vegetables, decrease eating out and work on meal planning and intentional eating  Yolanda Chavez has agreed to follow up with our clinic in 2 weeks. She was informed of the importance of frequent follow up visits to maximize her success with intensive lifestyle modifications for her multiple health conditions.  ALLERGIES: Allergies  Allergen Reactions  . Adhesive [Tape] Itching and Rash    MEDICATIONS: Current Outpatient Medications on File Prior to Visit  Medication Sig Dispense Refill  . acetaminophen (TYLENOL) 325 MG tablet Take 325 mg by mouth as needed.    . divalproex (DEPAKOTE ER) 500 MG 24 hr tablet Take 1,000 mg by mouth at bedtime.     . fluvoxaMINE (LUVOX) 100 MG tablet Take 100 mg by mouth 2 (two) times daily.     [name] gabapentin (NEURONTIN) 100 MG capsule Take 100-200 mg by mouth 2 (two) times daily. Take 100 mg every morning  Take 200 mg every night    . levocetirizine (XYZAL) 5 MG tablet Take 5 mg by mouth every evening.    [name] levothyroxine (SYNTHROID, LEVOTHROID) 50 MCG tablet Take 50 mcg by mouth daily before breakfast.     . metFORMIN (GLUCOPHAGE) 500 MG tablet Take 1 tablet (500 mg total) by mouth daily with lunch. 30 tablet 0  . montelukast (SINGULAIR) 10 MG tablet Take 10 mg by mouth at bedtime.    . ondansetron (ZOFRAN ODT) 4 MG disintegrating tablet Take 1 tablet (4 mg total) by mouth every 8 (eight) hours as needed for nausea or vomiting. 10 tablet 0  . oxyCODONE (OXY IR/ROXICODONE) 5 MG immediate release tablet Take 5 mg by mouth every 6 (six) hours as needed for moderate pain.     . promethazine (PHENERGAN) 25 MG tablet Take 25 mg by mouth every 6 (six) hours as needed for nausea or vomiting.    [name] QUEtiapine (SEROQUEL) 100 MG tablet Take 200 mg by mouth at bedtime.     . sucralfate (CARAFATE) 1 g tablet Take 1 g by mouth 4 (four) times daily -  with meals and at bedtime.    . topiramate (TOPAMAX) 50 MG tablet Take 1  tablet (50 mg total) by mouth daily. 30 tablet 0  . VENTOLIN HFA 108 (90 Base) MCG/ACT inhaler   0  . Vitamin D, Ergocalciferol, (DRISDOL) 1.25 MG (50000 UT) CAPS capsule Take 1 capsule (50,000 Units total) by mouth every 7 (seven) days. 4 capsule 0   No current facility-administered medications on file prior to visit.     PAST MEDICAL HISTORY: Past Medical History:  Diagnosis Date  . Anemia   . Anxiety   . Back pain   . Bipolar 1 disorder ([place])   . Cancer ([place])   . Depression   . Edema, lower extremity   . Gallbladder problem   . GERD (gastroesophageal reflux disease)   . 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 History of blood transfusion   . Hypothyroidism   . Sleep apnea   . Umbilical hernia     PAST SURGICAL HISTORY: Past Surgical History:  Procedure Laterality Date  . 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 BREAST CYST EXCISION Right   . 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 gall stones    . HERNIA REPAIR  07/2011   uh with PVP, complicated by infection, mesh removal and primary repair of hernia  . JOINT REPLACEMENT     bilateral knee  . latareces    . MELANOMA EXCISION  may 2012   cancerous cells removed from  left side of nose   . thumb surgery  1977  . TUBAL LIGATION      SOCIAL HISTORY: Social History   Tobacco Use  . Smoking status: Never Smoker  . Smokeless tobacco: Never Used  Substance Use Topics  . Alcohol use: No  . Drug use: No    FAMILY HISTORY: Family History  Problem Relation Age of Onset  . Heart disease Mother        arrythmia  . Cancer Mother   . Anxiety disorder Mother   . Cancer Father        leukemia  . Depression Father   . Diabetes Father   . High blood pressure Father     ROS: Review of Systems  Constitutional: Positive for weight loss.  Gastrointestinal: Negative for nausea and vomiting.  Musculoskeletal:       Negative for muscle weakness  Endo/Heme/Allergies:       Negative for polyphagia   Psychiatric/Behavioral: Positive for depression. Negative for suicidal ideas.    PHYSICAL EXAM: Blood pressure 118/73, pulse 83, temperature 98 F (36.7 C), temperature source Oral, height 5\' 4"  (1.626 m), weight 272 lb (123.4 kg), SpO2 98 %. Body mass index is 46.69 kg/m. Physical Exam Vitals signs reviewed.  Constitutional:      Appearance: Normal appearance. She is well-developed. She is obese.  Cardiovascular:     Rate and Rhythm: Normal rate.  Pulmonary:     Effort: Pulmonary effort is normal.  Musculoskeletal: Normal range of motion.  Skin:    General: Skin is warm and dry.  Neurological:     Mental Status: She is alert and oriented to person, place, and time.  Psychiatric:        Mood and Affect: Mood normal.        Behavior: Behavior normal.        Thought Content: Thought content does not include homicidal or suicidal ideation.     RECENT LABS AND TESTS: BMET    Component Value Date/Time   NA 143 08/04/2019 1230   K 4.5 08/04/2019 1230   CL 102 08/04/2019 1230   CO2 26 08/04/2019 1230   GLUCOSE 128 (H) 08/04/2019 1230   GLUCOSE 133 (H) 03/01/2016 1450   BUN 18 08/04/2019 1230   CREATININE 0.81 08/04/2019 1230   CREATININE 0.72 08/21/2011 1723   CALCIUM 9.9 08/04/2019 1230   GFRNONAA 74 08/04/2019 1230   GFRAA 86 08/04/2019 1230   Lab Results  Component Value Date   HGBA1C 5.6 08/04/2019   HGBA1C 5.6 12/31/2018   Lab Results  Component Value Date   INSULIN 14.7 08/04/2019   INSULIN 12.7 12/31/2018   CBC    Component Value Date/Time   WBC 6.1 03/01/2016 1450   RBC 4.69 03/01/2016 1450   HGB 14.3 03/01/2016 1450   HCT 42.9 03/01/2016 1450   PLT 206 03/01/2016 1450   MCV 91.5 03/01/2016 1450   MCH 30.5 03/01/2016 1450   MCHC 33.3 03/01/2016 1450   RDW 14.0 03/01/2016 1450   LYMPHSABS  2.3 08/21/2011 1723   MONOABS 0.5 08/21/2011 1723   EOSABS 0.2 08/21/2011 1723   BASOSABS 0.1 08/21/2011 1723   Iron/TIBC/Ferritin/ %Sat No results found  for: IRON, TIBC, FERRITIN, IRONPCTSAT Lipid Panel     Component Value Date/Time   CHOL 238 (H) 08/04/2019 1230   TRIG 216 (H) 08/04/2019 1230   HDL 50 08/04/2019 1230   LDLCALC 149 (H) 08/04/2019 1230   Hepatic Function Panel     Component Value Date/Time   PROT 7.0 08/04/2019 1230   ALBUMIN 4.4 08/04/2019 1230   AST 11 08/04/2019 1230   ALT 17 08/04/2019 1230   ALKPHOS 86 08/04/2019 1230   BILITOT 0.4 08/04/2019 1230   No results found for: TSH    Ref. Range 12/31/2018 14:10  Vitamin D, 25-Hydroxy Latest Ref Range: 30.0 - 100.0 ng/mL 10.8 (L)    OBESITY BEHAVIORAL INTERVENTION VISIT  Today's visit was # 16   Starting weight: 294 lbs Starting date: 12/31/2018 Today's weight : 272 lbs Today's date: 08/04/2019 Total lbs lost to date: 22    08/04/2019  Height 5\' 4"  (1.626 m)  Weight 272 lb (123.4 kg)  BMI (Calculated) 46.67  BLOOD PRESSURE - SYSTOLIC [id number]  BLOOD PRESSURE - DIASTOLIC 73   Body Fat % [id number] %  Total Body Water (lbs) 86.8 lbs    ASK: We discussed the diagnosis of obesity with Yolanda Chavez today and Yolanda Chavez agreed to give [name] permission to discuss obesity behavioral modification therapy today.  ASSESS: Yolanda Chavez has the diagnosis of obesity and her BMI today is 46.67 Yolanda Chavez is in the action stage of change   ADVISE: Yolanda Chavez was educated on the multiple health risks of obesity as well as the benefit of weight loss to improve her health. She was advised of the need for long term treatment and the importance of lifestyle modifications to improve her current health and to decrease her risk of future health problems.  AGREE: Multiple dietary modification options and treatment options were discussed and  Yolanda Chavez agreed to follow the recommendations documented in the above note.  ARRANGE: Yolanda Chavez was educated on the importance of frequent visits to treat obesity as outlined per CMS and USPSTF guidelines and agreed to schedule her next follow up appointment  today.  Yolanda Chavez, am acting as Location manager for [employer]. Owens Shark, DO  I have reviewed the above documentation for accuracy and completeness, and I agree with the above. -Jearld Lesch, DO

## 2019-08-11 ENCOUNTER — Encounter (INDEPENDENT_AMBULATORY_CARE_PROVIDER_SITE_OTHER): Payer: Self-pay | Admitting: Bariatrics

## 2019-08-18 ENCOUNTER — Other Ambulatory Visit: Payer: Self-pay

## 2019-08-18 ENCOUNTER — Ambulatory Visit (INDEPENDENT_AMBULATORY_CARE_PROVIDER_SITE_OTHER): Payer: Medicare Other | Admitting: Psychology

## 2019-08-18 DIAGNOSIS — F3189 Other bipolar disorder: Secondary | ICD-10-CM

## 2019-08-19 ENCOUNTER — Encounter (INDEPENDENT_AMBULATORY_CARE_PROVIDER_SITE_OTHER): Payer: Self-pay | Admitting: Bariatrics

## 2019-08-19 ENCOUNTER — Ambulatory Visit (INDEPENDENT_AMBULATORY_CARE_PROVIDER_SITE_OTHER): Payer: Medicare Other | Admitting: Bariatrics

## 2019-08-19 VITALS — BP 114/72 | HR 85 | Temp 98.3°F | Ht 64.0 in | Wt 272.0 lb

## 2019-08-19 DIAGNOSIS — E559 Vitamin D deficiency, unspecified: Secondary | ICD-10-CM | POA: Diagnosis not present

## 2019-08-19 DIAGNOSIS — F3289 Other specified depressive episodes: Secondary | ICD-10-CM

## 2019-08-19 DIAGNOSIS — E8881 Metabolic syndrome: Secondary | ICD-10-CM

## 2019-08-19 DIAGNOSIS — Z6841 Body Mass Index (BMI) 40.0 and over, adult: Secondary | ICD-10-CM

## 2019-08-19 MED ORDER — TOPIRAMATE 50 MG PO TABS: 50.0000 mg | ORAL_TABLET | Freq: Every day | ORAL | 0 refills | Status: DC

## 2019-08-19 MED ORDER — VITAMIN D (ERGOCALCIFEROL) 1.25 MG (50000 UNIT) PO CAPS: 50000.0000 [IU] | ORAL_CAPSULE | ORAL | 0 refills | Status: DC

## 2019-08-20 ENCOUNTER — Encounter (INDEPENDENT_AMBULATORY_CARE_PROVIDER_SITE_OTHER): Payer: Self-pay | Admitting: Bariatrics

## 2019-08-20 NOTE — Progress Notes (Signed)
Office: [phone]  /  Fax: [phone]   HPI:   Chief Complaint: OBESITY Yolanda Chavez is here to discuss her progress with her obesity treatment plan. She is on the  follow the Category 3 plan and is following her eating plan approximately 0 % of the time. She states she is exercising 0 minutes 0 times per week. Catera has maintained the same weight.  Her weight is 272 lb (123.4 kg) today and has not lost weight since her last visit. She has lost 22 lbs since starting treatment with [name].  Vitamin D deficiency Starlite has a diagnosis of vitamin D deficiency. She is currently taking vit D and denies nausea, vomiting or muscle weakness. Vitamin D level is 22.7.   Insulin Resistance Analisha has a diagnosis of insulin resistance based on her elevated fasting insulin level of 14.7 and HgA1c of 5.6. Although Catina's blood glucose readings are still under good control, insulin resistance puts her at greater risk of metabolic syndrome and diabetes. She is taking metformin currently and continues to work on diet and exercise to decrease risk of diabetes.  Depression with emotional eating behaviors Juelz is struggling with emotional eating and using food for comfort to the extent that it is negatively impacting her health. She often snacks when she is not hungry. Winny sometimes feels she is out of control and then feels guilty that she made poor food choices. She has been working on behavior modification techniques to help reduce her emotional eating and has been somewhat successful. She is taking Topamax. Admits to increased cravings.  She shows no sign of suicidal or homicidal ideations.  Depression screen [facility] 2/9 12/31/2018  Decreased Interest 3  Down, Depressed, Hopeless 1  PHQ - 2 Score 4  Altered sleeping 0  Tired, decreased energy 3  Change in appetite 2  Feeling bad or failure about yourself  0  Trouble concentrating 3  Moving slowly or fidgety/restless 0  Suicidal thoughts 0  PHQ-9  Score 12  Difficult doing work/chores Not difficult at all    ASSESSMENT AND PLAN:  Vitamin D deficiency - Plan: Vitamin D, Ergocalciferol, (DRISDOL) 1.25 MG (50000 UT) CAPS capsule  Insulin resistance  Other depression - with emotional eating - Plan: topiramate (TOPAMAX) 50 MG tablet  Class 3 severe obesity with serious comorbidity and body mass index (BMI) of 45.0 to 49.9 in adult, unspecified obesity type ([facility])  Other depression - with emotional eating  - Plan: topiramate (TOPAMAX) 50 MG tablet  PLAN: Vitamin D Deficiency Deserae was informed that low vitamin D levels contributes to fatigue and are associated with obesity, breast, and colon cancer. She agrees to continue to take prescription Vit D @50 ,000 IU every week #4 with no refills and will follow up for routine testing of vitamin D, at least 2-3 times per year. She was informed of the risk of over-replacement of vitamin D and agrees to not increase her dose unless she discusses this with [name] first. Agrees to follow up with our clinic as directed.   Insulin Resistance Rodnisha will continue to work on weight loss, exercise, and decreasing simple carbohydrates in her diet to help decrease the risk of diabetes. We dicussed metformin including benefits and risks. She was informed that eating too many simple carbohydrates or too many calories at one sitting increases the likelihood of GI side effects. Kyong agrees to continue taking medications as prescribed. Yenni agreed to follow up with [name] as directed to monitor her progress.  Depression with  Emotional Eating Behaviors We discussed behavior modification techniques today to help Tamberlyn deal with her emotional eating and depression. She has agreed to continue to take Topamax 50 mg daily #30 with no refills and agreed to follow up as directed.  Obesity Tabbatha is currently in the action stage of change. As such, her goal is to continue with weight loss efforts She has agreed to follow  the Category 3 plan Cirsten has been instructed to work up to a goal of 150 minutes of combined cardio and strengthening exercise per week for weight loss and overall health benefits. We discussed the following Behavioral Modification Strategies today: increasing water intake, no skipping meals, keep healthy foods in the home, planning for success,  increasing lean protein intake, decreasing simple carbohydrates , increasing vegetables, decrease eating out and work on meal planning and easy cooking plans We reviewed labs and discussed meal planning.   Heena has agreed to follow up with our clinic in 2-3 weeks. She was informed of the importance of frequent follow up visits to maximize her success with intensive lifestyle modifications for her multiple health conditions.  ALLERGIES: Allergies  Allergen Reactions  . Adhesive [Tape] Itching and Rash    MEDICATIONS: Current Outpatient Medications on File Prior to Visit  Medication Sig Dispense Refill  . acetaminophen (TYLENOL) 325 MG tablet Take 325 mg by mouth as needed.    . divalproex (DEPAKOTE ER) 500 MG 24 hr tablet Take 1,000 mg by mouth at bedtime.     . fluvoxaMINE (LUVOX) 100 MG tablet Take 100 mg by mouth 2 (two) times daily.     [name] gabapentin (NEURONTIN) 100 MG capsule Take 100-200 mg by mouth 2 (two) times daily. Take 100 mg every morning  Take 200 mg every night    . levocetirizine (XYZAL) 5 MG tablet Take 5 mg by mouth every evening.    [name] levothyroxine (SYNTHROID, LEVOTHROID) 50 MCG tablet Take 50 mcg by mouth daily before breakfast.     . metFORMIN (GLUCOPHAGE) 500 MG tablet Take 1 tablet (500 mg total) by mouth daily with lunch. 30 tablet 0  . montelukast (SINGULAIR) 10 MG tablet Take 10 mg by mouth at bedtime.    . ondansetron (ZOFRAN ODT) 4 MG disintegrating tablet Take 1 tablet (4 mg total) by mouth every 8 (eight) hours as needed for nausea or vomiting. 10 tablet 0  . oxyCODONE (OXY IR/ROXICODONE) 5 MG immediate release  tablet Take 5 mg by mouth every 6 (six) hours as needed for moderate pain.     . promethazine (PHENERGAN) 25 MG tablet Take 25 mg by mouth every 6 (six) hours as needed for nausea or vomiting.    [name] QUEtiapine (SEROQUEL) 100 MG tablet Take 200 mg by mouth at bedtime.     . sucralfate (CARAFATE) 1 g tablet Take 1 g by mouth 4 (four) times daily -  with meals and at bedtime.    . VENTOLIN HFA 108 (90 Base) MCG/ACT inhaler   0   No current facility-administered medications on file prior to visit.     PAST MEDICAL HISTORY: Past Medical History:  Diagnosis Date  . Anemia   . Anxiety   . Back pain   . Bipolar 1 disorder ([place])   . Cancer ([place])   . Depression   . Edema, lower extremity   . Gallbladder problem   . GERD (gastroesophageal reflux disease)   . 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 History of blood transfusion   .  Hypothyroidism   . Sleep apnea   . Umbilical hernia     PAST SURGICAL HISTORY: Past Surgical History:  Procedure Laterality Date  . 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 BREAST CYST EXCISION Right   . 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 gall stones    . HERNIA REPAIR  07/2011   uh with PVP, complicated by infection, mesh removal and primary repair of hernia  . JOINT REPLACEMENT     bilateral knee  . latareces    . MELANOMA EXCISION  may 2012   cancerous cells removed from  left side of nose   . thumb surgery  1977  . TUBAL LIGATION      SOCIAL HISTORY: Social History   Tobacco Use  . Smoking status: Never Smoker  . Smokeless tobacco: Never Used  Substance Use Topics  . Alcohol use: No  . Drug use: No    FAMILY HISTORY: Family History  Problem Relation Age of Onset  . Heart disease Mother        arrythmia  . Cancer Mother   . Anxiety disorder Mother   . Cancer Father        leukemia  . Depression Father   . Diabetes Father   . High blood pressure Father     ROS: Review of Systems  Constitutional: Negative for weight loss.   Gastrointestinal: Negative for nausea and vomiting.  Musculoskeletal:       Negative for muscle weakness  Psychiatric/Behavioral: Positive for depression. Negative for suicidal ideas.    PHYSICAL EXAM: Blood pressure 114/72, pulse 85, temperature 98.3 F (36.8 C), temperature source Other (Comment), height 5\' 4"  (1.626 m), weight 272 lb (123.4 kg). Body mass index is 46.69 kg/m. Physical Exam Vitals signs reviewed.  Constitutional:      Appearance: Normal appearance. She is obese.  HENT:     Head: Normocephalic.     Nose: Nose normal.  Neck:     Musculoskeletal: Normal range of motion.  Cardiovascular:     Rate and Rhythm: Normal rate.  Pulmonary:     Effort: Pulmonary effort is normal.  Musculoskeletal: Normal range of motion.  Skin:    General: Skin is warm and dry.  Neurological:     Mental Status: She is alert and oriented to person, place, and time.  Psychiatric:        Mood and Affect: Mood normal.        Behavior: Behavior normal.     RECENT LABS AND TESTS: BMET    Component Value Date/Time   NA 143 08/04/2019 1230   K 4.5 08/04/2019 1230   CL 102 08/04/2019 1230   CO2 26 08/04/2019 1230   GLUCOSE 128 (H) 08/04/2019 1230   GLUCOSE 133 (H) 03/01/2016 1450   BUN 18 08/04/2019 1230   CREATININE 0.81 08/04/2019 1230   CREATININE 0.72 08/21/2011 1723   CALCIUM 9.9 08/04/2019 1230   GFRNONAA 74 08/04/2019 1230   GFRAA 86 08/04/2019 1230   Lab Results  Component Value Date   HGBA1C 5.6 08/04/2019   HGBA1C 5.6 12/31/2018   Lab Results  Component Value Date   INSULIN 14.7 08/04/2019   INSULIN 12.7 12/31/2018   CBC    Component Value Date/Time   WBC 6.1 03/01/2016 1450   RBC 4.69 03/01/2016 1450   HGB 14.3 03/01/2016 1450   HCT 42.9 03/01/2016 1450   PLT 206 03/01/2016 1450   MCV  91.5 03/01/2016 1450   MCH 30.5 03/01/2016 1450   MCHC 33.3 03/01/2016 1450   RDW 14.0 03/01/2016 1450   LYMPHSABS 2.3 08/21/2011 1723   MONOABS 0.5 08/21/2011 1723    EOSABS 0.2 08/21/2011 1723   BASOSABS 0.1 08/21/2011 1723   Iron/TIBC/Ferritin/ %Sat No results found for: IRON, TIBC, FERRITIN, IRONPCTSAT Lipid Panel     Component Value Date/Time   CHOL 238 (H) 08/04/2019 1230   TRIG 216 (H) 08/04/2019 1230   HDL 50 08/04/2019 1230   LDLCALC 149 (H) 08/04/2019 1230   Hepatic Function Panel     Component Value Date/Time   PROT 7.0 08/04/2019 1230   ALBUMIN 4.4 08/04/2019 1230   AST 11 08/04/2019 1230   ALT 17 08/04/2019 1230   ALKPHOS 86 08/04/2019 1230   BILITOT 0.4 08/04/2019 1230   No results found for: TSH   Ref. Range 08/04/2019 12:30  Vitamin D, 25-Hydroxy Latest Ref Range: 30.0 - 100.0 ng/mL 22.7 (L)     OBESITY BEHAVIORAL INTERVENTION VISIT  Today's visit was # 16   Starting weight: 294 lbs Starting date: 12/31/18 Today's weight : Weight: 272 lb (123.4 kg)  Today's date: 08/19/19 Total lbs lost to date: 22 At least 15 minutes were spent on discussing the following behavioral intervention visit.   ASK: We discussed the diagnosis of obesity with Binnie Kand Wollam today and Juliany agreed to give [name] permission to discuss obesity behavioral modification therapy today.  ASSESS: Cyrina has the diagnosis of obesity and her BMI today is 46.67 Berkli is in the action stage of change   ADVISE: Jamiyla was educated on the multiple health risks of obesity as well as the benefit of weight loss to improve her health. She was advised of the need for long term treatment and the importance of lifestyle modifications to improve her current health and to decrease her risk of future health problems.  AGREE: Multiple dietary modification options and treatment options were discussed and  Tiffny agreed to follow the recommendations documented in the above note.  ARRANGE: Niari was educated on the importance of frequent visits to treat obesity as outlined per CMS and USPSTF guidelines and agreed to schedule her next follow up appointment  today.  Leary Roca, am acting as transcriptionist for [employer], DO   I have reviewed the above documentation for accuracy and completeness, and I agree with the above. -Jearld Lesch, DO

## 2019-08-25 NOTE — Progress Notes (Signed)
Office: [phone]  /  Fax: [phone]    Date: August 31, 2001   Time Seen: 11:05am Duration: 24 minutes Provider: Glennie Isle, Psy.D. Type of Session: Individual Therapy  Type of Contact: Face-to-face  Session Content: Yolanda Chavez is a 68 y.o. female presenting for a follow-up appointment to address the previously established treatment goal of decreasing emotional eating. The session was initiated with the administration of the PHQ-9 and GAD-7, as well as a brief check-in. Yolanda Chavez shared she "binged" on chocolate and peanuts last night and described feeling nauseous today. More specifically, Yolanda Chavez shared she ate 13 fun size chocolates while paying bills and working on online returns. Psychoeducation regarding triggers for emotional eating was provided to further increase awareness of eating patterns. Yolanda Chavez was provided a handout, and encouraged to utilize the handout between now and the next appointment to increase awareness of triggers and frequency. Yolanda Chavez agreed. This provider also discussed behavioral strategies for specific triggers, such as placing the utensil down when conversing to avoid mindless eating. Yolanda Chavez identified engaging in out of habit and stress eating last night. Yolanda Chavez was receptive to today's session as evidenced by openness to sharing, responsiveness to feedback, and willingness to explore triggers for emotional eating.  Mental Status Examination:  Appearance: neat Behavior: cooperative Mood: euthymic Affect: mood congruent Speech: normal in rate, volume, and tone Eye Contact: appropriate Psychomotor Activity: appropriate Thought Process: linear, logical, and goal directed  Content/Perceptual Disturbances: denies suicidal and homicidal ideation, plan, and intent and no hallucinations, delusions, bizarre thinking or behavior reported or observed Orientation: time, person, place and purpose of appointment Cognition/Sensorium: memory, attention, language, and fund  of knowledge intact  Insight: good Judgment: good  Structured Assessment Results: The Patient Health Questionnaire-9 (PHQ-9) is a self-report measure that assesses symptoms and severity of depression over the course of the last two weeks. Yolanda Chavez obtained a score of 3 suggesting minimal depression. Yolanda Chavez finds the endorsed symptoms to be not difficult at all. Little interest or pleasure in doing things 0  Feeling down, depressed, or hopeless 0  Trouble falling or staying asleep, or sleeping too much 0  Feeling tired or having little energy 1  Poor appetite or overeating 1  Feeling bad about yourself --- or that you are a failure or have let yourself or your family down 1  Trouble concentrating on things, such as reading the newspaper or watching television 0  Moving or speaking so slowly that other people could have noticed? Or the opposite --- being so fidgety or restless that you have been moving around a lot more than usual 0  Thoughts that you would be better off dead or hurting yourself in some way 0  PHQ-9 Score 3    The Generalized Anxiety Disorder-7 (GAD-7) is a brief self-report measure that assesses symptoms of anxiety over the course of the last two weeks. Yolanda Chavez obtained a score of 1 suggesting minimal anxiety. Yolanda Chavez finds the endorsed symptoms to be not difficult at all. Feeling nervous, anxious, on edge 0  Not being able to stop or control worrying 1  Worrying too much about different things 0  Trouble relaxing 0  Being so restless that it's hard to sit still 0  Becoming easily annoyed or irritable 0  Feeling afraid as if something awful might happen 0  GAD-7 Score 1   Interventions:  Conducted a brief chart review Verbal administration of PHQ-9 and GAD-7 for symptom monitoring Provided empathic reflections and validation Reviewed content from the previous session Psychoeducation  provided regarding triggers for emotional eating Employed motivational interviewing skills  to assess patient's willingness/desire to adhere to recommended medical treatments and assignments Focused on rapport building Employed supportive psychotherapy interventions to facilitate reduced distress, and to improve coping skills with identified stressors  DSM-5 Diagnosis: 296.89 (F31.89) Other Specified Bipolar and Related Disorder, Emotional Eating Behaviors  Treatment Goal & Progress: During the initial appointment with this provider, the following treatment goal was established: decrease emotional eating. Yolanda Chavez has demonstrated some progress in her goal as evidenced by increased awareness of hunger patterns.   Plan: Yolanda Chavez continues to appear able and willing to participate as evidenced by engagement in reciprocal conversation, and asking questions for clarification as appropriate. Due to the upcoming holiday and this provider being out of the office in the coming weeks, the next appointment will be scheduled in three weeks. The next session will focus on reviewing learned skills, and working towards the established treatment goal.

## 2019-09-01 ENCOUNTER — Ambulatory Visit (INDEPENDENT_AMBULATORY_CARE_PROVIDER_SITE_OTHER): Payer: Medicare Other | Admitting: Psychology

## 2019-09-01 ENCOUNTER — Other Ambulatory Visit: Payer: Self-pay

## 2019-09-01 DIAGNOSIS — F3189 Other bipolar disorder: Secondary | ICD-10-CM | POA: Diagnosis not present

## 2019-09-09 ENCOUNTER — Ambulatory Visit (INDEPENDENT_AMBULATORY_CARE_PROVIDER_SITE_OTHER): Payer: Medicare Other | Admitting: Bariatrics

## 2019-09-09 ENCOUNTER — Other Ambulatory Visit: Payer: Self-pay

## 2019-09-09 VITALS — BP 122/61 | HR 74 | Temp 98.2°F | Ht 64.0 in | Wt 276.0 lb

## 2019-09-09 DIAGNOSIS — Z6841 Body Mass Index (BMI) 40.0 and over, adult: Secondary | ICD-10-CM | POA: Diagnosis not present

## 2019-09-09 DIAGNOSIS — E8881 Metabolic syndrome: Secondary | ICD-10-CM | POA: Diagnosis not present

## 2019-09-09 DIAGNOSIS — E559 Vitamin D deficiency, unspecified: Secondary | ICD-10-CM

## 2019-09-09 DIAGNOSIS — Z9189 Other specified personal risk factors, not elsewhere classified: Secondary | ICD-10-CM | POA: Diagnosis not present

## 2019-09-09 DIAGNOSIS — F3289 Other specified depressive episodes: Secondary | ICD-10-CM | POA: Diagnosis not present

## 2019-09-09 MED ORDER — TOPIRAMATE 50 MG PO TABS: 50.0000 mg | ORAL_TABLET | Freq: Every day | ORAL | 0 refills | Status: DC

## 2019-09-09 MED ORDER — NALTREXONE-BUPROPION HCL ER 8-90 MG PO TB12: ORAL_TABLET | ORAL | 0 refills | Status: DC

## 2019-09-09 MED ORDER — VITAMIN D (ERGOCALCIFEROL) 1.25 MG (50000 UNIT) PO CAPS: 50000.0000 [IU] | ORAL_CAPSULE | ORAL | 0 refills | Status: DC

## 2019-09-09 NOTE — Progress Notes (Signed)
Office: [phone]  /  Fax: [phone]    Date: September 22, 2019   Time Seen: 10:03am Duration: 24 minutes Provider: Glennie Isle, Psy.D. Type of Session: Individual Therapy  Type of Contact: Face-to-face  Session Content: Yolanda Chavez is a 68 y.o. female presenting for a follow-up appointment to address the previously established treatment goal of decreasing emotional eating. The session was initiated with the administration of the PHQ-9 and GAD-7, as well as a brief check-in. Raffinee shared things have been "pretty good." She noted she reviewed previously shared handouts and noted, "I don't have some of these triggers." She acknowledged her emotional eating is secondary to out of habit eating and squelching urgency. She noted a reduction in emotional eating during the day, but reported it continues in the evenings. However, Meladee noted she saves snack calories for the evening. Burkley further shared about her recent psychiatrist appointment and noted, "This is the best I've felt over the years."  Psychoeducation regarding the connection between thoughts, feelings, and behaviors was provided. Gearldine was given a handout outlining the aforementioned. Emotional eating episodes were used as examples to outline the connection. Additionally, psychoeducation regarding pleasurable activities, including its impact on emotional eating and overall well-being was provided. Cereniti was provided with a handout with various options of pleasurable activities, and was encouraged to engage in one activity a day and additional activities as needed when triggered to emotionally eat. Judah agreed and was observing marking activities she would like to do. Furthermore, termination planning was discussed. She was receptive to scheduling a follow-up appointment in 3 weeks and an additional follow-up/termination appointment in 3-4 weeks after that. Davelyn was receptive to today's session as evidenced by openness to sharing,  responsiveness to feedback, and willingness to engage in pleasurable activities.  Mental Status Examination:  Appearance: neat Behavior: cooperative Mood: euthymic Affect: mood congruent Speech: normal in rate, volume, and tone Eye Contact: appropriate Psychomotor Activity: appropriate Thought Process: linear, logical, and goal directed  Content/Perceptual Disturbances: denies suicidal and homicidal ideation, plan, and intent and no hallucinations, delusions, bizarre thinking or behavior reported or observed Orientation: time, person, place and purpose of appointment Cognition/Sensorium: memory, attention, language, and fund of knowledge intact  Insight: good Judgment: good  Structured Assessment Results: The Patient Health Questionnaire-9 (PHQ-9) is a self-report measure that assesses symptoms and severity of depression over the course of the last two weeks. Maddyn obtained a score of 0. Little interest or pleasure in doing things 0  Feeling down, depressed, or hopeless 0  Trouble falling or staying asleep, or sleeping too much 0  Feeling tired or having little energy 0  Poor appetite or overeating 0  Feeling bad about yourself --- or that you are a failure or have let yourself or your family down 0  Trouble concentrating on things, such as reading the newspaper or watching television 0  Moving or speaking so slowly that other people could have noticed? Or the opposite --- being so fidgety or restless that you have been moving around a lot more than usual 0  Thoughts that you would be better off dead or hurting yourself in some way 0  PHQ-9 Score 0    The Generalized Anxiety Disorder-7 (GAD-7) is a brief self-report measure that assesses symptoms of anxiety over the course of the last two weeks. Yejin obtained a score of 0. Feeling nervous, anxious, on edge 0  Not being able to stop or control worrying 0  Worrying too much about different things 0  Trouble relaxing 0  Being so  restless that it's hard to sit still 0  Becoming easily annoyed or irritable 0  Feeling afraid as if something awful might happen 0  GAD-7 Score 0   Interventions:  Conducted a brief chart review Verbal administration of PHQ-9 and GAD-7 for symptom monitoring Provided empathic reflections and validation Reviewed content from the previous session Psychoeducation provided regarding the connection between thoughts, feelings, and behaviors Psychoeducation provided regarding pleasurable activities Employed motivational interviewing skills to assess patient's willingness/desire to adhere to recommended medical treatments and assignments Employed supportive psychotherapy interventions to facilitate reduced distress, and to improve coping skills with identified stressors  Discussed termination planning  DSM-5 Diagnosis: 296.89 (F31.89) Other Specified Bipolar and Related Disorder, Emotional Eating Behaviors  Treatment Goal & Progress: During the initial appointment with this provider, the following treatment goal was established: decrease emotional eating. [name] has demonstrated progress in her goal as evidenced by increased awareness of hunger patterns and triggers for emotional eating. [name] also reported a reduction in emotional eating and demonstrates willingness to engage in pleasurable activities.  Plan: Katika continues to appear able and willing to participate as evidenced by engagement in reciprocal conversation, and asking questions for clarification as appropriate. The next appointment will be scheduled in three weeks. The next session will focus on reviewing learned skills, and working towards the established treatment goal.

## 2019-09-10 ENCOUNTER — Encounter (INDEPENDENT_AMBULATORY_CARE_PROVIDER_SITE_OTHER): Payer: Self-pay | Admitting: Bariatrics

## 2019-09-10 NOTE — Progress Notes (Signed)
Office: [phone]  /  Fax: [phone]   HPI:   Chief Complaint: OBESITY Yolanda Chavez is here to discuss her progress with her obesity treatment plan. She is on the Category 3 plan and is following her eating plan approximately 50-60% of the time. She states she is exercising 0 minutes 0 times per week. Yolanda Chavez is not following the plan well and is sleeping until 2:00 p.m. She has not been taking her metformin and states she is snacking late into the night. Her weight is 276 lb (125.2 kg) today and has had a weight gain of 4 lbs since her last visit. She has lost 18 lbs since starting treatment with [name].  Depression, Other  Yolanda Chavez is struggling with emotional eating and using food for comfort to the extent that it is negatively impacting her health. She often snacks when she is not hungry. Yolanda Chavez sometimes feels she is out of control and then feels guilty that she made poor food choices. She has been working on behavior modification techniques to help reduce her emotional eating and has been somewhat successful. Yolanda Chavez has seen Dr. Mallie Mussel. She shows no sign of suicidal or homicidal ideations.  Depression screen PHQ 2/9 12/31/2018  Decreased Interest 3  Down, Depressed, Hopeless 1  PHQ - 2 Score 4  Altered sleeping 0  Tired, decreased energy 3  Change in appetite 2  Feeling bad or failure about yourself  0  Trouble concentrating 3  Moving slowly or fidgety/restless 0  Suicidal thoughts 0  PHQ-9 Score 12  Difficult doing work/chores Not difficult at all   Insulin Resistance Yolanda Chavez has a diagnosis of insulin resistance based on her elevated fasting insulin level >5. Last A1c 5.6 on 08/04/2019 with an insulin of 14.7. Although Yolanda Chavez's blood glucose readings are still under good control, insulin resistance puts her at greater risk of metabolic syndrome and diabetes. She never started metformin as she was getting up at 2:00 p.m. and told to take at 12:00 with her meal.   At risk for diabetes  Yolanda Chavez is at higher than average risk for developing diabetes due to her obesity. She currently denies polyuria or polydipsia.  Vitamin D deficiency Yolanda Chavez has a diagnosis of Vitamin D deficiency. She is currently taking prescription Vit D and denies nausea, vomiting or muscle weakness.  ASSESSMENT AND PLAN:  Insulin resistance  Vitamin D deficiency - Plan: Vitamin D, Ergocalciferol, (DRISDOL) 1.25 MG (50000 UT) CAPS capsule  Other depression,emotional eating - Plan: topiramate (TOPAMAX) 50 MG tablet  At risk for diabetes mellitus  Class 3 severe obesity with serious comorbidity and body mass index (BMI) of 45.0 to 49.9 in adult, unspecified obesity type ([place]) - Plan: Naltrexone-buPROPion HCl ER 8-90 MG TB12  PLAN:  Depression, Other We discussed behavior modification techniques today to help Yolanda Chavez deal with her emotional eating and depression. Yolanda Chavez was given a prescription for Topiramate 50 mg 1 PO QD #30 with 0 refills and agrees to follow-up with our clinic in 2-3 weeks. She will continue to see Dr. Mallie Mussel.  Insulin Resistance Yolanda Chavez will continue to work on weight loss, exercise, and decreasing simple carbohydrates in her diet to help decrease the risk of diabetes. We dicussed metformin including benefits and risks. She was informed that eating too many simple carbohydrates or too many calories at one sitting increases the likelihood of GI side effects. Yolanda Chavez will take metformin with her first meal and follow-up as directed to monitor her progress.  Diabetes risk counseling Yolanda Chavez was given  extended (15 minutes) diabetes prevention counseling today. She is 68 y.o. female and has risk factors for diabetes including obesity. We discussed intensive lifestyle modifications today with an emphasis on weight loss as well as increasing exercise and decreasing simple carbohydrates in her diet.  Vitamin D Deficiency Yolanda Chavez was informed that low Vitamin D levels contributes to fatigue and  are associated with obesity, breast, and colon cancer. She agrees to continue to take prescription Vit D @ 50,000 IU every week #4 with 0 refills and will follow-up for routine testing of Vitamin D, at least 2-3 times per year. She was informed of the risk of over-replacement of Vitamin D and agrees to not increase her dose unless she discusses this with [name] first. Yolanda Chavez agrees to follow-up with our clinic in 2-3 weeks.  Obesity Yolanda Chavez is currently in the action stage of change. As such, her goal is to continue with weight loss efforts. She has agreed to follow the Category 3 plan. Yolanda Chavez will work on meal planning and work on greater adherence to the plan. Yolanda Chavez has been instructed to work up to a goal of 150 minutes of combined cardio and strengthening exercise per week for weight loss and overall health benefits. We discussed the following Behavioral Modification Strategies today: increasing lean protein intake, decreasing simple carbohydrates, increasing vegetables, increase H20 intake, decrease eating out, no skipping meals, work on meal planning and easy cooking plans, keeping healthy foods in the home, travel eating strategies, holiday eating strategies, celebration eating strategies, and avoiding temptations.  Yolanda Chavez was given a prescription for Contrave ER 8-90 mg starting titration with 1 daily #120 with 0 refills. She agrees to follow-up with our clinic in 2-3 weeks.  Yolanda Chavez has agreed to follow-up with our clinic in 2-3 weeks. She was informed of the importance of frequent follow-up visits to maximize her success with intensive lifestyle modifications for her multiple health conditions.  ALLERGIES: Allergies  Allergen Reactions  . Adhesive [Tape] Itching and Rash    MEDICATIONS: Current Outpatient Medications on File Prior to Visit  Medication Sig Dispense Refill  . acetaminophen (TYLENOL) 325 MG tablet Take 325 mg by mouth as needed.    . divalproex (DEPAKOTE ER) 500 MG 24 hr  tablet Take 1,000 mg by mouth at bedtime.     . fluvoxaMINE (LUVOX) 100 MG tablet Take 100 mg by mouth 2 (two) times daily.     [name] gabapentin (NEURONTIN) 100 MG capsule Take 100-200 mg by mouth 2 (two) times daily. Take 100 mg every morning  Take 200 mg every night    . levocetirizine (XYZAL) 5 MG tablet Take 5 mg by mouth every evening.    [name] levothyroxine (SYNTHROID, LEVOTHROID) 50 MCG tablet Take 50 mcg by mouth daily before breakfast.     . metFORMIN (GLUCOPHAGE) 500 MG tablet Take 1 tablet (500 mg total) by mouth daily with lunch. 30 tablet 0  . montelukast (SINGULAIR) 10 MG tablet Take 10 mg by mouth at bedtime.    . ondansetron (ZOFRAN ODT) 4 MG disintegrating tablet Take 1 tablet (4 mg total) by mouth every 8 (eight) hours as needed for nausea or vomiting. 10 tablet 0  . promethazine (PHENERGAN) 25 MG tablet Take 25 mg by mouth every 6 (six) hours as needed for nausea or vomiting.    [name] QUEtiapine (SEROQUEL) 100 MG tablet Take 200 mg by mouth at bedtime.     . sucralfate (CARAFATE) 1 g tablet Take 1 g by mouth 4 (four) times  daily -  with meals and at bedtime.    . VENTOLIN HFA 108 (90 Base) MCG/ACT inhaler   0   No current facility-administered medications on file prior to visit.     PAST MEDICAL HISTORY: Past Medical History:  Diagnosis Date  . Anemia   . Anxiety   . Back pain   . Bipolar 1 disorder ([place])   . Cancer ([place])   . Depression   . Edema, lower extremity   . Gallbladder problem   . GERD (gastroesophageal reflux disease)   . 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 History of blood transfusion   . Hypothyroidism   . Sleep apnea   . Umbilical hernia     PAST SURGICAL HISTORY: Past Surgical History:  Procedure Laterality Date  . 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 BREAST CYST EXCISION Right   . 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 gall stones    . HERNIA REPAIR  07/2011   uh with PVP, complicated by infection, mesh removal and primary repair of  hernia  . JOINT REPLACEMENT     bilateral knee  . latareces    . MELANOMA EXCISION  may 2012   cancerous cells removed from  left side of nose   . thumb surgery  1977  . TUBAL LIGATION      SOCIAL HISTORY: Social History   Tobacco Use  . Smoking status: Never Smoker  . Smokeless tobacco: Never Used  Substance Use Topics  . Alcohol use: No  . Drug use: No    FAMILY HISTORY: Family History  Problem Relation Age of Onset  . Heart disease Mother        Yolanda Chavez  . Cancer Mother   . Anxiety disorder Mother   . Cancer Father        leukemia  . Depression Father   . Diabetes Father   . High blood pressure Father    ROS: Review of Systems  Gastrointestinal: Negative for nausea and vomiting.  Musculoskeletal:       Negative for muscle weakness.  Psychiatric/Behavioral: Positive for depression. Negative for suicidal ideas.       Negative for homicidal ideas.   PHYSICAL EXAM: Blood pressure 122/61, pulse 74, temperature 98.2 F (36.8 C), height 5\' 4"  (1.626 m), weight 276 lb (125.2 kg), SpO2 (!) 84 %. Body mass index is 47.38 kg/m. Physical Exam Vitals signs reviewed.  Constitutional:      Appearance: Normal appearance. She is obese.  Cardiovascular:     Rate and Rhythm: Normal rate.     Pulses: Normal pulses.  Pulmonary:     Effort: Pulmonary effort is normal.     Breath sounds: Normal breath sounds.  Musculoskeletal: Normal range of motion.  Skin:    General: Skin is warm and dry.  Neurological:     Mental Status: She is alert and oriented to person, place, and time.  Psychiatric:        Behavior: Behavior normal.   RECENT LABS AND TESTS: BMET    Component Value Date/Time   NA 143 08/04/2019 1230   K 4.5 08/04/2019 1230   CL 102 08/04/2019 1230   CO2 26 08/04/2019 1230   GLUCOSE 128 (H) 08/04/2019 1230   GLUCOSE 133 (H) 03/01/2016 1450   BUN 18 08/04/2019 1230   CREATININE 0.81 08/04/2019 1230   CREATININE 0.72 08/21/2011 1723   CALCIUM 9.9  08/04/2019 1230   GFRNONAA  74 08/04/2019 1230   GFRAA 86 08/04/2019 1230   Lab Results  Component Value Date   HGBA1C 5.6 08/04/2019   HGBA1C 5.6 12/31/2018   Lab Results  Component Value Date   INSULIN 14.7 08/04/2019   INSULIN 12.7 12/31/2018   CBC    Component Value Date/Time   WBC 6.1 03/01/2016 1450   RBC 4.69 03/01/2016 1450   HGB 14.3 03/01/2016 1450   HCT 42.9 03/01/2016 1450   PLT 206 03/01/2016 1450   MCV 91.5 03/01/2016 1450   MCH 30.5 03/01/2016 1450   MCHC 33.3 03/01/2016 1450   RDW 14.0 03/01/2016 1450   LYMPHSABS 2.3 08/21/2011 1723   MONOABS 0.5 08/21/2011 1723   EOSABS 0.2 08/21/2011 1723   BASOSABS 0.1 08/21/2011 1723   Iron/TIBC/Ferritin/ %Sat No results found for: IRON, TIBC, FERRITIN, IRONPCTSAT Lipid Panel     Component Value Date/Time   CHOL 238 (H) 08/04/2019 1230   TRIG 216 (H) 08/04/2019 1230   HDL 50 08/04/2019 1230   LDLCALC 149 (H) 08/04/2019 1230   Hepatic Function Panel     Component Value Date/Time   PROT 7.0 08/04/2019 1230   ALBUMIN 4.4 08/04/2019 1230   AST 11 08/04/2019 1230   ALT 17 08/04/2019 1230   ALKPHOS 86 08/04/2019 1230   BILITOT 0.4 08/04/2019 1230   No results found for: TSH  Results for SANIYYA, NERI (MRN [id number]) as of 09/10/2019 07:19  Ref. Range 08/04/2019 12:30  Vitamin D, 25-Hydroxy Latest Ref Range: 30.0 - 100.0 ng/mL 22.7 (L)   OBESITY BEHAVIORAL INTERVENTION VISIT  Today's visit was #18  Starting weight: 294 lbs Starting date: 12/31/2018 Today's weight: 276 lbs  Today's date: 09/09/2019 Total lbs lost to date: 18     09/09/2019  Height 5\' 4"  (1.626 m)  Weight 276 lb (125.2 kg)  BMI (Calculated) 47.35  BLOOD PRESSURE - SYSTOLIC [id number]  BLOOD PRESSURE - DIASTOLIC 61   Body Fat % [id number] %   ASK: We discussed the diagnosis of obesity with Marilynn Latino today and Yairi agreed to give [name] permission to discuss obesity behavioral modification therapy today.  ASSESS: Celestine has  the diagnosis of obesity and her BMI today is 47.5. Deshon is in the action stage of change.   ADVISE: Jaklyn was educated on the multiple health risks of obesity as well as the benefit of weight loss to improve her health. She was advised of the need for long term treatment and the importance of lifestyle modifications to improve her current health and to decrease her risk of future health problems.  AGREE: Multiple dietary modification options and treatment options were discussed and  Yerania agreed to follow the recommendations documented in the above note.  ARRANGE: Shamani was educated on the importance of frequent visits to treat obesity as outlined per CMS and USPSTF guidelines and agreed to schedule her next follow up appointment today.  Migdalia Dk, am acting as Location manager for [employer], DO  I have reviewed the above documentation for accuracy and completeness, and I agree with the above. -Jearld Lesch, DO

## 2019-09-22 ENCOUNTER — Other Ambulatory Visit: Payer: Self-pay

## 2019-09-22 ENCOUNTER — Ambulatory Visit (INDEPENDENT_AMBULATORY_CARE_PROVIDER_SITE_OTHER): Payer: Medicare Other | Admitting: Psychology

## 2019-09-22 DIAGNOSIS — F3189 Other bipolar disorder: Secondary | ICD-10-CM | POA: Diagnosis not present

## 2019-09-29 NOTE — Progress Notes (Signed)
  Office: [phone]  /  Fax: [phone]    Date: October 12, 2019   Time Seen: 2:26pm Duration: 30 minutes Provider: Glennie Isle, Psy.D. Type of Session: Individual Therapy  Type of Contact: Face-to-face  Session Content: Yolanda Chavez is a 68 y.o. female presenting for a follow-up appointment to address the previously established treatment goal of decreasing emotional eating. The session was initiated with a brief check-in. Yolanda Chavez shared about recent events, including holiday plans. Yolanda Chavez reported a reduction in emotional eating. She also discussed being "nervous" today secondary to her dog's well-being. The aforementioned contributed to decreased appetite and a panic attack. Associated thoughts and feelings were processed. Regarding previously discussed skills, Yolanda Chavez reported engaging in pleasurable activities (e.g., coloring and reading) and shared examples of where she was able to notice the connection between her thoughts, feelings and behaviors. She added, "This is amazing!" Positive reinforcement was provided. Moreover, psychoeducation regarding mindfulness was provided. A handout was provided to Yolanda Chavez with further information regarding mindfulness, including exercises. This provider also explained the benefit of mindfulness as it relates to emotional eating, overall well-being, and panic attacks. Yolanda Chavez was encouraged to engage in the provided exercises between now and the next appointment with this provider. Yolanda Chavez agreed. During today's appointment, Yolanda Chavez was led through a mindfulness exercise involving her senses. Overall,  Yolanda Chavez was receptive to today's appointment as evidenced by openness to sharing, responsiveness to feedback, and willingness to engage in mindfulness exercises to assist with coping.  Mental Status Examination:  Appearance: well groomed and appropriate hygiene  Behavior: appropriate to circumstances Mood: euthymic Affect: mood congruent Speech: normal in rate,  volume, and tone Eye Contact: appropriate Psychomotor Activity: appropriate Gait: normal Thought Process: linear, logical, and goal directed  Thought Content/Perception: denies suicidal and homicidal ideation, plan, and intent and no hallucinations, delusions, bizarre thinking or behavior reported or observed Orientation: time, person, place and purpose of appointment Memory/Concentration: memory, attention, language, and fund of knowledge intact  Insight/Judgment: good  Interventions:  Conducted a brief chart review Provided empathic reflections and validation Reviewed content from the previous session Provided positive reinforcement Employed supportive psychotherapy interventions to facilitate reduced distress and to improve coping skills with identified stressors Employed motivational interviewing skills to assess patient's willingness/desire to adhere to recommended medical treatments and assignments Psychoeducation provided regarding mindfulness Engaged patient in mindfulness exercise(s) Employed acceptance and commitment interventions to emphasize mindfulness and acceptance without struggle  DSM-5 Diagnosis: 296.89 (F31.89) Other Specified Bipolar and Related Disorder, Emotional Eating Behaviors   Treatment Goal & Progress: During the initial appointment with this provider, the following treatment goal was established: decrease emotional eating. Yolanda Chavez has demonstrated progress in her goal as evidenced by increased awareness of hunger patterns, increased awareness of triggers for emotional eating and reduction in emotional eating. Yolanda Chavez also continues to demonstrate willingness to engage in learned skill(s).  Plan: Due to the upcoming holidays and office closures, the next appointment will be scheduled in approximately three weeks. The next session will focus on working towards the established treatment goal.

## 2019-09-30 ENCOUNTER — Encounter (INDEPENDENT_AMBULATORY_CARE_PROVIDER_SITE_OTHER): Payer: Self-pay | Admitting: Bariatrics

## 2019-09-30 ENCOUNTER — Other Ambulatory Visit: Payer: Self-pay

## 2019-09-30 ENCOUNTER — Ambulatory Visit (INDEPENDENT_AMBULATORY_CARE_PROVIDER_SITE_OTHER): Payer: Medicare Other | Admitting: Bariatrics

## 2019-09-30 VITALS — BP 119/74 | HR 76 | Temp 97.7°F | Ht 64.0 in | Wt 276.0 lb

## 2019-09-30 DIAGNOSIS — Z6841 Body Mass Index (BMI) 40.0 and over, adult: Secondary | ICD-10-CM | POA: Diagnosis not present

## 2019-09-30 DIAGNOSIS — E038 Other specified hypothyroidism: Secondary | ICD-10-CM | POA: Diagnosis not present

## 2019-09-30 DIAGNOSIS — F3289 Other specified depressive episodes: Secondary | ICD-10-CM | POA: Diagnosis not present

## 2019-09-30 NOTE — Progress Notes (Signed)
Office: [phone]  /  Fax: [phone]   HPI:   Chief Complaint: OBESITY Yolanda Chavez is here to discuss her progress with her obesity treatment plan. She is on the Category 3 plan and is following her eating plan approximately 45% of the time. She states she is exercising 0 minutes 0 times per week. Yolanda Chavez's weight has remained the same. She did well over the holiday. Her weight is 276 lb (125.2 kg) today and has not lost weight since her last visit. She has lost 18 lbs since starting treatment with [name].  Depression with emotional eating behaviors Yolanda Chavez is struggling with emotional eating and using food for comfort to the extent that it is negatively impacting her health. She often snacks when she is not hungry. Yolanda Chavez sometimes feels she is out of control and then feels guilty that she made poor food choices. She has been working on behavior modification techniques to help reduce her emotional eating and has been somewhat successful. Yolanda Chavez is seeing Dr. Mallie Mussel, our bariatric psychologist. She shows no sign of suicidal or homicidal ideations.  Depression screen PHQ 2/9 12/31/2018  Decreased Interest 3  Down, Depressed, Hopeless 1  PHQ - 2 Score 4  Altered sleeping 0  Tired, decreased energy 3  Change in appetite 2  Feeling bad or failure about yourself  0  Trouble concentrating 3  Moving slowly or fidgety/restless 0  Suicidal thoughts 0  PHQ-9 Score 12  Difficult doing work/chores Not difficult at all   Hypothyroidism Yolanda Chavez has a diagnosis of hypothyroidism. She is on Synthroid. She denies hot or cold intolerance or palpitations, but does admit to ongoing fatigue.  ASSESSMENT AND PLAN:  Other specified hypothyroidism  Other depression, emotional eating  Class 3 severe obesity with serious comorbidity and body mass index (BMI) of 45.0 to 49.9 in adult, unspecified obesity type ([place])  PLAN:  Emotional Eating Behaviors (other depression) Behavior modification techniques were  discussed today to help Ellina deal with her emotional/non-hunger eating behaviors. Avangeline will continue seeing Dr. Mallie Mussel and will follow-up as directed.  Hypothyroidism Yolanda Chavez is aware that keeping her TSH in the normal range is best for weight loss. She will continue her medication and follow-up as directed.   Obesity Yolanda Chavez is currently in the action stage of change. As such, her goal is to continue with weight loss efforts. She has agreed to follow the Category 3 plan. Yolanda Chavez will work on meal planning. Yolanda Chavez has been instructed to work up to a goal of 150 minutes of combined cardio and strengthening exercise per week for weight loss and overall health benefits. We discussed the following Behavioral Modification Strategies today: increasing lean protein intake, decreasing simple carbohydrates, increasing vegetables, increase H20 intake, decrease eating out, no skipping meals, work on meal planning and easy cooking plans, keeping healthy foods in the home, ways to avoid boredom eating, better snacking choices, keep a strict food journal, and decrease junk food.  Yolanda Chavez has agreed to follow-up with our clinic in 2-4 weeks. She was informed of the importance of frequent follow-up visits to maximize her success with intensive lifestyle modifications for her multiple health conditions.  ALLERGIES: Allergies  Allergen Reactions  . Adhesive [Tape] Itching and Rash    MEDICATIONS: Current Outpatient Medications on File Prior to Visit  Medication Sig Dispense Refill  . acetaminophen (TYLENOL) 325 MG tablet Take 325 mg by mouth as needed.    . divalproex (DEPAKOTE ER) 500 MG 24 hr tablet Take 1,000 mg by mouth at  bedtime.     . fluvoxaMINE (LUVOX) 100 MG tablet Take 100 mg by mouth 2 (two) times daily.     [name] gabapentin (NEURONTIN) 100 MG capsule Take 100-200 mg by mouth 2 (two) times daily. Take 100 mg every morning  Take 200 mg every night    . levocetirizine (XYZAL) 5 MG tablet Take 5 mg  by mouth every evening.    [name] levothyroxine (SYNTHROID, LEVOTHROID) 50 MCG tablet Take 50 mcg by mouth daily before breakfast.     . metFORMIN (GLUCOPHAGE) 500 MG tablet Take 1 tablet (500 mg total) by mouth daily with lunch. 30 tablet 0  . montelukast (SINGULAIR) 10 MG tablet Take 10 mg by mouth at bedtime.    . Naltrexone-buPROPion HCl ER 8-90 MG TB12 Take two tabs twice daily 120 tablet 0  . ondansetron (ZOFRAN ODT) 4 MG disintegrating tablet Take 1 tablet (4 mg total) by mouth every 8 (eight) hours as needed for nausea or vomiting. 10 tablet 0  . promethazine (PHENERGAN) 25 MG tablet Take 25 mg by mouth every 6 (six) hours as needed for nausea or vomiting.    [name] QUEtiapine (SEROQUEL) 100 MG tablet Take 200 mg by mouth at bedtime.     . sucralfate (CARAFATE) 1 g tablet Take 1 g by mouth 4 (four) times daily -  with meals and at bedtime.    . topiramate (TOPAMAX) 50 MG tablet Take 1 tablet (50 mg total) by mouth daily. 30 tablet 0  . VENTOLIN HFA 108 (90 Base) MCG/ACT inhaler   0  . Vitamin D, Ergocalciferol, (DRISDOL) 1.25 MG (50000 UT) CAPS capsule Take 1 capsule (50,000 Units total) by mouth every 7 (seven) days. 4 capsule 0   No current facility-administered medications on file prior to visit.     PAST MEDICAL HISTORY: Past Medical History:  Diagnosis Date  . Anemia   . Anxiety   . Back pain   . Bipolar 1 disorder ([place])   . Cancer ([place])   . Depression   . Edema, lower extremity   . Gallbladder problem   . GERD (gastroesophageal reflux disease)   . 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 History of blood transfusion   . Hypothyroidism   . Sleep apnea   . Umbilical hernia     PAST SURGICAL HISTORY: Past Surgical History:  Procedure Laterality Date  . 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 BREAST CYST EXCISION Right   . 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 gall stones    . HERNIA REPAIR  07/2011   uh with PVP, complicated by infection, mesh removal and primary  repair of hernia  . JOINT REPLACEMENT     bilateral knee  . latareces    . MELANOMA EXCISION  may 2012   cancerous cells removed from  left side of nose   . thumb surgery  1977  . TUBAL LIGATION      SOCIAL HISTORY: Social History   Tobacco Use  . Smoking status: Never Smoker  . Smokeless tobacco: Never Used  Substance Use Topics  . Alcohol use: No  . Drug use: No    FAMILY HISTORY: Family History  Problem Relation Age of Onset  . Heart disease Mother        arrythmia  . Cancer Mother   . Anxiety disorder Mother   . Cancer Father        leukemia  . Depression Father   .  Diabetes Father   . High blood pressure Father    ROS: Review of Systems  Constitutional: Positive for malaise/fatigue.  Cardiovascular: Negative for palpitations.  Endo/Heme/Allergies:       Negative for hot/cold intolerance.  Psychiatric/Behavioral: Positive for depression. Negative for suicidal ideas.       Negative for homicidal ideas.   PHYSICAL EXAM: Blood pressure 119/74, pulse 76, temperature 97.7 F (36.5 C), temperature source Oral, height 5\' 4"  (1.626 m), weight 276 lb (125.2 kg), SpO2 96 %. Body mass index is 47.38 kg/m. Physical Exam Vitals signs reviewed.  Constitutional:      Appearance: Normal appearance. She is obese.  Cardiovascular:     Rate and Rhythm: Normal rate.     Pulses: Normal pulses.  Pulmonary:     Effort: Pulmonary effort is normal.     Breath sounds: Normal breath sounds.  Musculoskeletal: Normal range of motion.  Skin:    General: Skin is warm and dry.  Neurological:     Mental Status: She is alert and oriented to person, place, and time.  Psychiatric:        Behavior: Behavior normal.   RECENT LABS AND TESTS: BMET    Component Value Date/Time   NA 143 08/04/2019 1230   K 4.5 08/04/2019 1230   CL 102 08/04/2019 1230   CO2 26 08/04/2019 1230   GLUCOSE 128 (H) 08/04/2019 1230   GLUCOSE 133 (H) 03/01/2016 1450   BUN 18 08/04/2019 1230    CREATININE 0.81 08/04/2019 1230   CREATININE 0.72 08/21/2011 1723   CALCIUM 9.9 08/04/2019 1230   GFRNONAA 74 08/04/2019 1230   GFRAA 86 08/04/2019 1230   Lab Results  Component Value Date   HGBA1C 5.6 08/04/2019   HGBA1C 5.6 12/31/2018   Lab Results  Component Value Date   INSULIN 14.7 08/04/2019   INSULIN 12.7 12/31/2018   CBC    Component Value Date/Time   WBC 6.1 03/01/2016 1450   RBC 4.69 03/01/2016 1450   HGB 14.3 03/01/2016 1450   HCT 42.9 03/01/2016 1450   PLT 206 03/01/2016 1450   MCV 91.5 03/01/2016 1450   MCH 30.5 03/01/2016 1450   MCHC 33.3 03/01/2016 1450   RDW 14.0 03/01/2016 1450   LYMPHSABS 2.3 08/21/2011 1723   MONOABS 0.5 08/21/2011 1723   EOSABS 0.2 08/21/2011 1723   BASOSABS 0.1 08/21/2011 1723   Iron/TIBC/Ferritin/ %Sat No results found for: IRON, TIBC, FERRITIN, IRONPCTSAT Lipid Panel     Component Value Date/Time   CHOL 238 (H) 08/04/2019 1230   TRIG 216 (H) 08/04/2019 1230   HDL 50 08/04/2019 1230   LDLCALC 149 (H) 08/04/2019 1230   Hepatic Function Panel     Component Value Date/Time   PROT 7.0 08/04/2019 1230   ALBUMIN 4.4 08/04/2019 1230   AST 11 08/04/2019 1230   ALT 17 08/04/2019 1230   ALKPHOS 86 08/04/2019 1230   BILITOT 0.4 08/04/2019 1230   No results found for: TSH  Results for PARVEEN, GORSKY (MRN [id number]) as of 09/30/2019 16:07  Ref. Range 08/04/2019 12:30  Vitamin D, 25-Hydroxy Latest Ref Range: 30.0 - 100.0 ng/mL 22.7 (L)   OBESITY BEHAVIORAL INTERVENTION VISIT  Today's visit was #19  Starting weight: 294 lbs Starting date: 12/31/2018 Today's weight: 276 lbs Today's date: 09/30/2019 Total lbs lost to date: 18  At least 15 minutes were spent on discussing the following behavioral intervention visit.    09/30/2019  Height 5\' 4"  (1.626 m)  Weight 276 lb (  125.2 kg)  BMI (Calculated) 47.35  BLOOD PRESSURE - SYSTOLIC 123456  BLOOD PRESSURE - DIASTOLIC 74   Body Fat % [id number] %   ASK: We discussed the  diagnosis of obesity with Binnie Kand Hagemann today and Tressa agreed to give [name] permission to discuss obesity behavioral modification therapy today.  ASSESS: Jariyah has the diagnosis of obesity and her BMI today is 47.4. Kristopher is in the action stage of change.   ADVISE: Raiden was educated on the multiple health risks of obesity as well as the benefit of weight loss to improve her health. She was advised of the need for long term treatment and the importance of lifestyle modifications to improve her current health and to decrease her risk of future health problems.  AGREE: Multiple dietary modification options and treatment options were discussed and  Janee agreed to follow the recommendations documented in the above note.  ARRANGE: Lain was educated on the importance of frequent visits to treat obesity as outlined per CMS and USPSTF guidelines and agreed to schedule her next follow up appointment today.  Migdalia Dk, am acting as Location manager for [employer], DO  I have reviewed the above documentation for accuracy and completeness, and I agree with the above. -Jearld Lesch, DO

## 2019-10-01 ENCOUNTER — Encounter (INDEPENDENT_AMBULATORY_CARE_PROVIDER_SITE_OTHER): Payer: Self-pay | Admitting: Bariatrics

## 2019-10-12 ENCOUNTER — Other Ambulatory Visit: Payer: Self-pay

## 2019-10-12 ENCOUNTER — Ambulatory Visit (INDEPENDENT_AMBULATORY_CARE_PROVIDER_SITE_OTHER): Payer: Medicare Other | Admitting: Psychology

## 2019-10-12 DIAGNOSIS — F3189 Other bipolar disorder: Secondary | ICD-10-CM

## 2019-10-13 ENCOUNTER — Ambulatory Visit (INDEPENDENT_AMBULATORY_CARE_PROVIDER_SITE_OTHER): Payer: Medicare Other | Admitting: Bariatrics

## 2019-10-27 NOTE — Progress Notes (Unsigned)
  Office: [phone]  /  Fax: [phone]    Date: November 05, 2019   Time Seen:*** Duration: *** minutes Provider: Glennie Isle, Psy.D. Type of Session: Individual Therapy  Type of Contact: Face-to-face  Session Content: Yolanda Chavez is a 69 y.o. female presenting for a follow-up appointment to address the previously established treatment goal of decreasing emotional eating. The session was initiated with the administration of the PHQ-9 and GAD-7, as well as a brief check-in. *** Yolanda Chavez was receptive to today's appointment as evidenced by openness to sharing, responsiveness to feedback, and {gbreceptiveness:23401}.  Mental Status Examination:  Appearance: {Appearance:22431} Behavior: {Behavior:22445} Mood: {gbmood:21757} Affect: {Affect:22436} Speech: {Speech:22432} Eye Contact: {Eye Contact:22433} Psychomotor Activity: {Motor Activity:22434} Gait: {gbgait:23404} Thought Process: {thought process:22448}  Thought Content/Perception: {disturbances:22451} Orientation: {Orientation:22437} Memory/Concentration: {gbcognition:22449} Insight/Judgment: {Insight:22446}  Structured Assessments Results: The Patient Health Questionnaire-9 (PHQ-9) is a self-report measure that assesses symptoms and severity of depression over the course of the last two weeks. Yolanda Chavez obtained a score of *** suggesting {GBPHQ9SEVERITY:21752}. Yolanda Chavez finds the endorsed symptoms to be {gbphq9difficulty:21754}. [0= Not at all; 1= Several days; 2= More than half the days; 3= Nearly every day] Little interest or pleasure in doing things ***  Feeling down, depressed, or hopeless ***  Trouble falling or staying asleep, or sleeping too much ***  Feeling tired or having little energy ***  Poor appetite or overeating ***  Feeling bad about yourself --- or that you are a failure or have let yourself or your family down ***  Trouble concentrating on things, such as reading the newspaper or watching television ***  Moving or  speaking so slowly that other people could have noticed? Or the opposite --- being so fidgety or restless that you have been moving around a lot more than usual ***  Thoughts that you would be better off dead or hurting yourself in some way ***  PHQ-9 Score ***    The Generalized Anxiety Disorder-7 (GAD-7) is a brief self-report measure that assesses symptoms of anxiety over the course of the last two weeks. Yolanda Chavez obtained a score of *** suggesting {gbgad7severity:21753}. Yolanda Chavez finds the endorsed symptoms to be {gbphq9difficulty:21754}. [0= Not at all; 1= Several days; 2= Over half the days; 3= Nearly every day] Feeling nervous, anxious, on edge ***  Not being able to stop or control worrying ***  Worrying too much about different things ***  Trouble relaxing ***  Being so restless that it's hard to sit still ***  Becoming easily annoyed or irritable ***  Feeling afraid as if something awful might happen ***  GAD-7 Score ***   Interventions:  {Interventions for Progress Notes:23405}  DSM-5 Diagnosis: 296.89 (F31.89) Other Specified Bipolar and Related Disorder, Emotional Eating Behaviors  Treatment Goal & Progress: During the initial appointment with this provider, the following treatment goal was established: decrease emotional eating. Yolanda Chavez has demonstrated progress in her goal as evidenced by {gbtxprogress:22839}. Yolanda Chavez also {gbtxprogress2:22951}.  Plan: The next appointment will be scheduled in {gbweeks:21758}. The next session will focus on {Plan for Next Appointment:23400}.

## 2019-11-03 ENCOUNTER — Ambulatory Visit (INDEPENDENT_AMBULATORY_CARE_PROVIDER_SITE_OTHER): Payer: Medicare Other | Admitting: Bariatrics

## 2019-11-05 ENCOUNTER — Ambulatory Visit (INDEPENDENT_AMBULATORY_CARE_PROVIDER_SITE_OTHER): Payer: Medicare Other | Admitting: Psychology

## 2019-11-27 ENCOUNTER — Other Ambulatory Visit: Payer: Self-pay | Admitting: Family Medicine

## 2019-11-27 DIAGNOSIS — Z1231 Encounter for screening mammogram for malignant neoplasm of breast: Secondary | ICD-10-CM

## 2019-12-03 ENCOUNTER — Other Ambulatory Visit: Payer: Self-pay | Admitting: Allergy

## 2019-12-03 ENCOUNTER — Other Ambulatory Visit: Payer: Self-pay

## 2019-12-03 ENCOUNTER — Ambulatory Visit
Admission: RE | Admit: 2019-12-03 | Discharge: 2019-12-03 | Disposition: A | Discharge status: Home / Self Care | Payer: Medicare Other | Source: Ambulatory Visit | Attending: Allergy | Admitting: Allergy

## 2019-12-03 DIAGNOSIS — J209 Acute bronchitis, unspecified: Secondary | ICD-10-CM

## 2019-12-29 ENCOUNTER — Telehealth (INDEPENDENT_AMBULATORY_CARE_PROVIDER_SITE_OTHER): Payer: Self-pay | Admitting: Psychology

## 2019-12-29 NOTE — Telephone Encounter (Signed)
  Office: [phone]  /  Fax: [phone]  Date of Call: December 29, 2019  Time of Call: 8:34am Provider: Glennie Isle, PsyD  CONTENT: This provider called Hassan Rowan to check-in and schedule a follow-up appointment. A HIPAA compliant voicemail was left requesting a call back.   PLAN: This provider will wait for Zandaya to call back. No further follow-up planned by this provider.

## 2020-01-06 ENCOUNTER — Ambulatory Visit
Admission: RE | Admit: 2020-01-06 | Discharge: 2020-01-06 | Disposition: A | Discharge status: Home / Self Care | Payer: Medicare Other | Source: Ambulatory Visit | Attending: Family Medicine | Admitting: Family Medicine

## 2020-01-06 ENCOUNTER — Other Ambulatory Visit: Payer: Self-pay

## 2020-01-06 DIAGNOSIS — Z1231 Encounter for screening mammogram for malignant neoplasm of breast: Secondary | ICD-10-CM

## 2020-01-22 ENCOUNTER — Ambulatory Visit: Payer: Medicare Other | Attending: Internal Medicine

## 2020-01-22 DIAGNOSIS — Z23 Encounter for immunization: Secondary | ICD-10-CM

## 2020-01-22 NOTE — Progress Notes (Signed)
   Covid-19 Vaccination Clinic  Name:  Yolanda Chavez    MRN: [id number] DOB: 12/13/1950  01/22/2020  [name] was observed post Covid-19 immunization for 15 minutes without incident. She was provided with Vaccine Information Sheet and instruction to access the V-Safe system.   [name] was instructed to call 911 with any severe reactions post vaccine: [name] Difficulty breathing  . Swelling of face and throat  . A fast heartbeat  . A bad rash all over body  . Dizziness and weakness   Immunizations Administered    Name Date Dose VIS Date Route   Moderna COVID-19 Vaccine 01/22/2020  8:49 AM 0.5 mL 09/22/2019 Intramuscular   Manufacturer: Moderna   Lot: [id number]   [id number]

## 2020-02-23 ENCOUNTER — Ambulatory Visit: Payer: Medicare Other | Attending: Internal Medicine

## 2020-02-23 DIAGNOSIS — Z23 Encounter for immunization: Secondary | ICD-10-CM

## 2020-02-23 NOTE — Progress Notes (Signed)
   Covid-19 Vaccination Clinic  Name:  Yolanda Chavez    MRN: [id number] DOB: 1951-01-17  02/23/2020  [name] was observed post Covid-19 immunization for 15 minutes without incident. She was provided with Vaccine Information Sheet and instruction to access the V-Safe system.   [name] was instructed to call 911 with any severe reactions post vaccine: [name] Difficulty breathing  . Swelling of face and throat  . A fast heartbeat  . A bad rash all over body  . Dizziness and weakness   Immunizations Administered    Name Date Dose VIS Date Route   Moderna COVID-19 Vaccine 02/23/2020  8:43 AM 0.5 mL 09/2019 Intramuscular   Manufacturer: Moderna   Lot: [id number]   [id number]

## 2020-12-19 ENCOUNTER — Other Ambulatory Visit: Payer: Self-pay | Admitting: Family Medicine

## 2020-12-19 DIAGNOSIS — Z1231 Encounter for screening mammogram for malignant neoplasm of breast: Secondary | ICD-10-CM

## 2021-02-09 ENCOUNTER — Ambulatory Visit
Admission: RE | Admit: 2021-02-09 | Discharge: 2021-02-09 | Disposition: A | Discharge status: Home / Self Care | Payer: Medicare Other | Source: Ambulatory Visit | Attending: Family Medicine | Admitting: Family Medicine

## 2021-02-09 ENCOUNTER — Other Ambulatory Visit: Payer: Self-pay

## 2021-02-09 DIAGNOSIS — Z1231 Encounter for screening mammogram for malignant neoplasm of breast: Secondary | ICD-10-CM

## 2021-03-30 DIAGNOSIS — G4710 Hypersomnia, unspecified: Secondary | ICD-10-CM | POA: Diagnosis not present

## 2021-03-30 DIAGNOSIS — G4733 Obstructive sleep apnea (adult) (pediatric): Secondary | ICD-10-CM | POA: Diagnosis not present

## 2021-04-05 DIAGNOSIS — D229 Melanocytic nevi, unspecified: Secondary | ICD-10-CM | POA: Diagnosis not present

## 2021-04-05 DIAGNOSIS — K120 Recurrent oral aphthae: Secondary | ICD-10-CM | POA: Diagnosis not present

## 2021-04-20 DIAGNOSIS — D696 Thrombocytopenia, unspecified: Secondary | ICD-10-CM | POA: Diagnosis not present

## 2021-04-20 DIAGNOSIS — C8338 Diffuse large B-cell lymphoma, lymph nodes of multiple sites: Secondary | ICD-10-CM | POA: Diagnosis not present

## 2021-04-20 DIAGNOSIS — G4733 Obstructive sleep apnea (adult) (pediatric): Secondary | ICD-10-CM | POA: Diagnosis not present

## 2021-04-25 DIAGNOSIS — L820 Inflamed seborrheic keratosis: Secondary | ICD-10-CM | POA: Diagnosis not present

## 2021-05-08 ENCOUNTER — Emergency Department (HOSPITAL_BASED_OUTPATIENT_CLINIC_OR_DEPARTMENT_OTHER): Payer: Medicare Other | Admitting: Radiology

## 2021-05-08 ENCOUNTER — Encounter (HOSPITAL_BASED_OUTPATIENT_CLINIC_OR_DEPARTMENT_OTHER): Payer: Self-pay | Admitting: *Deleted

## 2021-05-08 ENCOUNTER — Emergency Department (HOSPITAL_BASED_OUTPATIENT_CLINIC_OR_DEPARTMENT_OTHER)
Admission: EM | Admit: 2021-05-08 | Discharge: 2021-05-08 | Disposition: A | Discharge status: Home / Self Care | Payer: Medicare Other | Attending: Emergency Medicine | Admitting: Emergency Medicine

## 2021-05-08 ENCOUNTER — Other Ambulatory Visit: Payer: Self-pay

## 2021-05-08 DIAGNOSIS — S82831A Other fracture of upper and lower end of right fibula, initial encounter for closed fracture: Secondary | ICD-10-CM | POA: Diagnosis not present

## 2021-05-08 DIAGNOSIS — E039 Hypothyroidism, unspecified: Secondary | ICD-10-CM | POA: Diagnosis not present

## 2021-05-08 DIAGNOSIS — S89302A Unspecified physeal fracture of lower end of left fibula, initial encounter for closed fracture: Secondary | ICD-10-CM | POA: Diagnosis not present

## 2021-05-08 DIAGNOSIS — S82431A Displaced oblique fracture of shaft of right fibula, initial encounter for closed fracture: Secondary | ICD-10-CM | POA: Diagnosis not present

## 2021-05-08 DIAGNOSIS — M25561 Pain in right knee: Secondary | ICD-10-CM | POA: Diagnosis not present

## 2021-05-08 DIAGNOSIS — Z859 Personal history of malignant neoplasm, unspecified: Secondary | ICD-10-CM | POA: Diagnosis not present

## 2021-05-08 DIAGNOSIS — S8992XA Unspecified injury of left lower leg, initial encounter: Secondary | ICD-10-CM | POA: Diagnosis present

## 2021-05-08 DIAGNOSIS — Z96653 Presence of artificial knee joint, bilateral: Secondary | ICD-10-CM | POA: Diagnosis not present

## 2021-05-08 DIAGNOSIS — S82839A Other fracture of upper and lower end of unspecified fibula, initial encounter for closed fracture: Secondary | ICD-10-CM

## 2021-05-08 DIAGNOSIS — Z743 Need for continuous supervision: Secondary | ICD-10-CM | POA: Diagnosis not present

## 2021-05-08 DIAGNOSIS — R52 Pain, unspecified: Secondary | ICD-10-CM | POA: Diagnosis not present

## 2021-05-08 DIAGNOSIS — Z79899 Other long term (current) drug therapy: Secondary | ICD-10-CM | POA: Insufficient documentation

## 2021-05-08 DIAGNOSIS — S0990XA Unspecified injury of head, initial encounter: Secondary | ICD-10-CM | POA: Diagnosis not present

## 2021-05-08 DIAGNOSIS — M25571 Pain in right ankle and joints of right foot: Secondary | ICD-10-CM | POA: Insufficient documentation

## 2021-05-08 DIAGNOSIS — R404 Transient alteration of awareness: Secondary | ICD-10-CM | POA: Diagnosis not present

## 2021-05-08 DIAGNOSIS — Y92009 Unspecified place in unspecified non-institutional (private) residence as the place of occurrence of the external cause: Secondary | ICD-10-CM | POA: Diagnosis not present

## 2021-05-08 DIAGNOSIS — W01198A Fall on same level from slipping, tripping and stumbling with subsequent striking against other object, initial encounter: Secondary | ICD-10-CM | POA: Diagnosis not present

## 2021-05-08 MED ORDER — OXYCODONE-ACETAMINOPHEN 5-325 MG PO TABS: 1.0000 | ORAL_TABLET | Freq: Four times a day (QID) | ORAL | 0 refills | Status: AC | PRN

## 2021-05-08 NOTE — ED Provider Notes (Signed)
[facility] DEPT Provider Note   CSN: [id number] Arrival date & time: 05/08/21  1612     History Chief Complaint  Patient presents with   Fall   Ankle Pain   Knee Pain    Yolanda Chavez is a 70 y.o. female.  Patient is a 70 year old female with a history of hypothyroidism, GERD, prior right knee replacement who is presenting today because of a fall.  She was coming in from the pool to go to the bathroom and she tripped over her dog.  She felt her right knee twist and her right ankle twist.  Then she fell to the floor hitting her knee.  She denies hitting her head or loss of consciousness.  No other areas of pain.  Her most significant pain is in the lateral portion of her right ankle.  She denies any numbness or tingling.  Her right knee although having a total knee replacement she feels like it feels pretty good.  She does not take any anticoagulation.  The history is provided by the patient.  Fall This is a new problem. The current episode started 3 to 5 hours ago. The problem occurs constantly. The problem has not changed since onset.Associated symptoms comments: Right ankle pain. The symptoms are aggravated by walking. The symptoms are relieved by rest. She has tried rest for the symptoms. The treatment provided no relief.  Ankle Pain Knee Pain     Past Medical History:  Diagnosis Date   Anemia    Anxiety    Back pain    Bipolar 1 disorder (HCC)    Cancer (HCC)    Depression    Edema, lower extremity    Gallbladder problem    GERD (gastroesophageal reflux disease)    Hemolytic anemia ([place])    History of blood transfusion    Hypothyroidism    Sleep apnea    Umbilical hernia     Patient Active Problem List   Diagnosis Date Noted   Insulin resistance 01/19/2019   Vitamin D deficiency 01/19/2019   Class 3 severe obesity with serious comorbidity and body mass index (BMI) of 50.0 to 59.9 in adult ([place]) 01/01/2019   Bipolar affective  ([place]) 12/30/2018   Splenomegaly 05/10/2016   Abdominal mass 03/22/2016   Non-Hodgkin's lymphoma ([place]) 03/13/2016   Hypothyroid 10/13/2014   Peripheral edema 10/13/2014   Obstructive sleep apnea syndrome, severe 95/28/4132   Umbilical hernia without mention of obstruction or gangrene 07/10/2011    Past Surgical History:  Procedure Laterality Date   2 total knee replacements  2009 &2010   BREAST CYST EXCISION Right    BREAST SURGERY     fobrocyst tumor on right breast    gall stones     HERNIA REPAIR  07/2011   uh with PVP, complicated by infection, mesh removal and primary repair of hernia   JOINT REPLACEMENT     bilateral knee   latareces     MELANOMA EXCISION  may 2012   cancerous cells removed from  left side of nose    thumb surgery  1977   TUBAL LIGATION       OB History     Gravida  2   Para  2   Term      Preterm      AB      Living         SAB      IAB      Ectopic  Multiple      Live Births              Family History  Problem Relation Age of Onset   Heart disease Mother        arrythmia   Cancer Mother    Anxiety disorder Mother    Cancer Father        leukemia   Depression Father    Diabetes Father    High blood pressure Father     Social History   Tobacco Use   Smoking status: Never   Smokeless tobacco: Never  Vaping Use   Vaping Use: Never used  Substance Use Topics   Alcohol use: No   Drug use: No    Home Medications Prior to Admission medications   Medication Sig Start Date End Date Taking? Authorizing Provider  acetaminophen (TYLENOL) 325 MG tablet Take 325 mg by mouth as needed.    [provider]  divalproex (DEPAKOTE ER) 500 MG 24 hr tablet Take 1,000 mg by mouth at bedtime.     [provider]  fluvoxaMINE (LUVOX) 100 MG tablet Take 100 mg by mouth 2 (two) times daily.  04/19/14   [provider]  gabapentin (NEURONTIN) 100 MG capsule Take 100-200 mg by mouth 2 (two) times daily.  Take 100 mg every morning  Take 200 mg every night 04/05/14   [provider]  levocetirizine (XYZAL) 5 MG tablet Take 5 mg by mouth every evening.    [provider]  levothyroxine (SYNTHROID, LEVOTHROID) 50 MCG tablet Take 50 mcg by mouth daily before breakfast.  04/16/14   [provider]  metFORMIN (GLUCOPHAGE) 500 MG tablet Take 1 tablet (500 mg total) by mouth daily with lunch. 07/07/19   Jearld Lesch A, DO  montelukast (SINGULAIR) 10 MG tablet Take 10 mg by mouth at bedtime.    [provider]  Naltrexone-buPROPion HCl ER 8-90 MG TB12 Take two tabs twice daily 09/09/19   Jearld Lesch A, DO  ondansetron (ZOFRAN ODT) 4 MG disintegrating tablet Take 1 tablet (4 mg total) by mouth every 8 (eight) hours as needed for nausea or vomiting. 03/01/16   Sherwood Gambler, MD  promethazine (PHENERGAN) 25 MG tablet Take 25 mg by mouth every 6 (six) hours as needed for nausea or vomiting.    [provider]  QUEtiapine (SEROQUEL) 100 MG tablet Take 200 mg by mouth at bedtime.     [provider]  sucralfate (CARAFATE) 1 g tablet Take 1 g by mouth 4 (four) times daily -  with meals and at bedtime.    [provider]  topiramate (TOPAMAX) 50 MG tablet Take 1 tablet (50 mg total) by mouth daily. 09/09/19   Georgia Lopes, DO  VENTOLIN HFA 108 [phone] Base) MCG/ACT inhaler  01/31/16   [provider]  Vitamin D, Ergocalciferol, (DRISDOL) 1.25 MG (50000 UT) CAPS capsule Take 1 capsule (50,000 Units total) by mouth every 7 (seven) days. 09/09/19   Jearld Lesch A, DO    Allergies    Adhesive [tape]  Review of Systems   Review of Systems  All other systems reviewed and are negative.  Physical Exam Updated Vital Signs BP (!) 126/93 (BP Location: Left Arm)   Pulse 85   Temp 98.7 F (37.1 C) (Oral)   Resp 18   Ht 5\' 4"  (1.626 m)   Wt 133.8 kg   SpO2 100%   BMI 50.64 kg/m   Physical Exam Vitals and  nursing note reviewed.  Constitutional:       General: She is not in acute distress.    Appearance: Normal appearance.  HENT:     Head: Normocephalic and atraumatic.  Eyes:     Pupils: Pupils are equal, round, and reactive to light.  Cardiovascular:     Rate and Rhythm: Normal rate.     Pulses: Normal pulses.  Pulmonary:     Effort: Pulmonary effort is normal.  Musculoskeletal:        General: Swelling, tenderness and signs of injury present.     Cervical back: Normal range of motion and neck supple.     Right ankle: Swelling present. Tenderness present over the lateral malleolus. No medial malleolus, posterior TF ligament, base of 5th metatarsal or proximal fibula tenderness. Decreased range of motion.     Right Achilles Tendon: Normal.     Comments: Right knee with well-healed scar from a knee replacement.  Greater than 90 degrees of flexion.  Mild ecchymosis over the knee but no point tenderness.  Skin:    General: Skin is warm and dry.     Capillary Refill: Capillary refill takes less than 2 seconds.  Neurological:     Mental Status: She is alert. Mental status is at baseline.  Psychiatric:        Mood and Affect: Mood normal.        Behavior: Behavior normal.    ED Results / Procedures / Treatments   Labs (all labs ordered are listed, but only abnormal results are displayed) Labs Reviewed - No data to display  EKG None  Radiology DG Ankle Complete Right  Result Date: 05/08/2021 CLINICAL DATA:  Tripped over dog leading to fall. Right ankle and knee pain. EXAM: RIGHT ANKLE - COMPLETE 3+ VIEW COMPARISON:  None. FINDINGS: Oblique mildly displaced distal fibular fracture proximal to the ankle mortise extending to the level of the talus. There is mild lateral subluxation and tilt of the talus and widening of the medial clear space. No definite posterior tibial tubercle fracture. There is a small ankle joint effusion. Generalized soft tissue edema. IMPRESSION: Oblique mildly displaced distal fibular fracture with  widening of the medial clear space and mild lateral subluxation and tilt of the talus. Associated soft tissue edema and joint effusion. Electronically Signed   By: Keith Rake M.D.   On: 05/08/2021 17:46   DG Knee Complete 4 Views Right  Result Date: 05/08/2021 CLINICAL DATA:  Tripped over dog leading to fall.  Right knee pain. EXAM: RIGHT KNEE - COMPLETE 4+ VIEW COMPARISON:  None. FINDINGS: Right knee arthroplasty in expected alignment. No periprosthetic lucency or fracture. There has been patellar resurfacing. No significant joint effusion. Patellar tendon enthesophyte at the patellar insertion. There is prepatellar as well as generalized soft tissue edema. IMPRESSION: 1. Soft tissue edema. No fracture or subluxation of the right knee. 2. Right knee arthroplasty without complication. Electronically Signed   By: Keith Rake M.D.   On: 05/08/2021 17:47    Procedures Procedures   Medications Ordered in ED Medications - No data to display  ED Course  I have reviewed the triage vital signs and the nursing notes.  Pertinent labs & imaging results that were available during my care of the patient were reviewed by me and considered in my medical decision making (see chart for details).    MDM Rules/Calculators/A&P  Patient presenting today after mechanical fall at home over the dog.  She did twist her left knee but felt a pop in her ankle.  She has not been able to ambulate due to severe pain in her right ankle.  Prior total knee replacement but has good range of motion and low suspicion for injury of hardware.  Ankle is swollen and tender.  Fracture versus sprain.  Plain films are pending.  No head injury or LOC.  She does not take anticoagulation.  Neurovascularly intact.  6:27 PM Knee imaging is normal.  Ankle imaging shows an oblique mildly displaced distal fibular fracture with widening of the medial clear space and mild lateral subluxation and tilt of the  talus.  Patient was placed in a short leg splint.  She will be nonweightbearing and has a walker at home.  She is neurovascularly intact after splint placement.  She will follow-up with her orthopedist tomorrow for ongoing management.  She was given pain control.  MDM   Amount and/or Complexity of Data Reviewed Tests in the radiology section of CPT: ordered and reviewed Independent visualization of images, tracings, or specimens: yes  Patient Progress Patient progress: stable   Final Clinical Impression(s) / ED Diagnoses Final diagnoses:  Closed fracture of distal end of fibula, unspecified fracture morphology, initial encounter    Rx / DC Orders ED Discharge Orders          Ordered    oxyCODONE-acetaminophen (PERCOCET/ROXICET) 5-325 MG tablet  Every 6 hours PRN        05/08/21 1826             Blanchie Dessert, MD 05/08/21 1828

## 2021-05-08 NOTE — Discharge Instructions (Addendum)
Elevate your leg as often as you can to help with swelling.  Do not bear weight use the walker you have at home.  Call the orthopedics office tomorrow so that they can get you in for a follow-up and decide if anything needs to be done.

## 2021-05-08 NOTE — ED Triage Notes (Signed)
BIB by EMS after tripping over dog and falling, Pain in rt knee and ankle. No LOC or head injury.

## 2021-05-12 DIAGNOSIS — S82841A Displaced bimalleolar fracture of right lower leg, initial encounter for closed fracture: Secondary | ICD-10-CM | POA: Diagnosis not present

## 2021-05-16 ENCOUNTER — Other Ambulatory Visit: Payer: Self-pay | Admitting: Orthopaedic Surgery

## 2021-05-17 ENCOUNTER — Other Ambulatory Visit: Payer: Self-pay

## 2021-05-17 ENCOUNTER — Encounter (HOSPITAL_COMMUNITY)
Admission: RE | Admit: 2021-05-17 | Discharge: 2021-05-17 | Disposition: A | Discharge status: Home / Self Care | Payer: Medicare Other | Source: Ambulatory Visit | Attending: Orthopaedic Surgery | Admitting: Orthopaedic Surgery

## 2021-05-17 ENCOUNTER — Encounter (HOSPITAL_COMMUNITY): Payer: Self-pay

## 2021-05-17 DIAGNOSIS — Z01812 Encounter for preprocedural laboratory examination: Secondary | ICD-10-CM | POA: Diagnosis not present

## 2021-05-17 HISTORY — DX: Pneumonia, unspecified organism: J18.9

## 2021-05-17 HISTORY — DX: Unspecified asthma, uncomplicated: J45.909

## 2021-05-17 NOTE — Patient Instructions (Signed)
DUE TO COVID-19 ONLY ONE VISITOR IS ALLOWED TO COME WITH YOU AND STAY IN THE WAITING ROOM ONLY DURING PRE OP AND PROCEDURE DAY OF SURGERY.   TWO VISITOR  MAY VISIT WITH YOU AFTER SURGERY IN YOUR PRIVATE ROOM DURING VISITING HOURS ONLY!  YOU NEED TO HAVE A COVID 19 TEST On__not needed_____          Your procedure is scheduled on: 05-18-21   Report to [facility] Main  Entrance   Report to admitting at      1100 AM     Call this number if you have problems the morning of surgery [phone]    Remember: NO SOLID FOOD AFTER MIDNIGHT THE NIGHT PRIOR TO SURGERY. NOTHING BY MOUTH EXCEPT CLEAR LIQUIDS UNTIL     1000am . PLEASE FINISH ENSURE DRINK PER SURGEON ORDER  WHICH NEEDS TO BE COMPLETED AT      1000 am then nothing by mouth.     CLEAR LIQUID DIET   Foods Allowed                                                                     Foods Excluded water Black Coffee and tea, regular and decaf                             liquids that you cannot  Plain Jell-O any favor except red or purple                                           see through such as: Fruit ices (not with fruit pulp)                                                milk, soups, orange juice  Iced Popsicles                                                       All solid food Carbonated beverages, regular and diet                                    Cranberry, grape and apple juices Sports drinks like Gatorade Lightly seasoned clear broth or consume(fat free) Sugar, honey syrup _____________________________________________________________________     BRUSH YOUR TEETH MORNING OF SURGERY AND RINSE YOUR MOUTH OUT, NO CHEWING GUM CANDY OR MINTS.     Take these medicines the morning of surgery with A SIP OF WATER: inhaler and bring with you, oxycodone if needed,eye drops as usual,levothyroxine,Luvox                                 You may not have  any metal on your body including hair pins and               piercings  Do not wear jewelry, make-up, lotions, powders or perfumes, deodorant             Do not wear nail polish on your fingernails or toenails .  Do not shave  48 hours prior to surgery.                Do not bring valuables to the hospital. [place].  Contacts, dentures or bridgework may not be worn into surgery.       Patients discharged the day of surgery will not be allowed to drive home. IF YOU ARE HAVING SURGERY AND GOING HOME THE SAME DAY, YOU MUST HAVE AN ADULT TO DRIVE YOU HOME AND BE WITH YOU FOR 24 HOURS. YOU MAY GO HOME BY TAXI OR UBER OR ORTHERWISE, BUT AN ADULT MUST ACCOMPANY YOU HOME AND STAY WITH YOU FOR 24 HOURS.  Name and phone number of your driver:  Special Instructions: N/A              Please read over the following fact sheets you were given: _____________________________________________________________________             [facility] - Preparing for Surgery Before surgery, you can play an important role.  Because skin is not sterile, your skin needs to be as free of germs as possible.  You can reduce the number of germs on your skin by washing with CHG (chlorahexidine gluconate) soap before surgery.  CHG is an antiseptic cleaner which kills germs and bonds with the skin to continue killing germs even after washing. Please DO NOT use if you have an allergy to CHG or antibacterial soaps.  If your skin becomes reddened/irritated stop using the CHG and inform your nurse when you arrive at Short Stay. Do not shave (including legs and underarms) for at least 48 hours prior to the first CHG shower.  You may shave your face/neck. Please follow these instructions carefully:  1.  Shower with CHG Soap the night before surgery and the  morning of Surgery.  2.  If you choose to wash your hair, wash your hair first as usual with your  normal  shampoo.  3.  After you shampoo, rinse your hair and body thoroughly to remove the   shampoo.                           4.  Use CHG as you would any other liquid soap.  You can apply chg directly  to the skin and wash                       Gently with a scrungie or clean washcloth.  5.  Apply the CHG Soap to your body ONLY FROM THE NECK DOWN.   Do not use on face/ open                           Wound or open sores. Avoid contact with eyes, ears mouth and genitals (private parts).                       Wash face,  Development worker, international aid (private  parts) with your normal soap.             6.  Wash thoroughly, paying special attention to the area where your surgery  will be performed.  7.  Thoroughly rinse your body with warm water from the neck down.  8.  DO NOT shower/wash with your normal soap after using and rinsing off  the CHG Soap.                9.  Pat yourself dry with a clean towel.            10.  Wear clean pajamas.            11.  Place clean sheets on your bed the night of your first shower and do not  sleep with pets. Day of Surgery : Do not apply any lotions/deodorants the morning of surgery.  Please wear clean clothes to the hospital/surgery center.  FAILURE TO FOLLOW THESE INSTRUCTIONS MAY RESULT IN THE CANCELLATION OF YOUR SURGERY PATIENT SIGNATURE_________________________________  NURSE SIGNATURE__________________________________   Incentive Spirometer  An incentive spirometer is a tool that can help keep your lungs clear and active. This tool measures how well you are filling your lungs with each breath. Taking long deep breaths may help reverse or decrease the chance of developing breathing (pulmonary) problems (especially infection) following: A long period of time when you are unable to move or be active. BEFORE THE PROCEDURE  If the spirometer includes an indicator to show your best effort, your nurse or respiratory therapist will set it to a desired goal. If possible, sit up straight or lean slightly forward. Try not to slouch. Hold the incentive spirometer in an  upright position. INSTRUCTIONS FOR USE  Sit on the edge of your bed if possible, or sit up as far as you can in bed or on a chair. Hold the incentive spirometer in an upright position. Breathe out normally. Place the mouthpiece in your mouth and seal your lips tightly around it. Breathe in slowly and as deeply as possible, raising the piston or the ball toward the top of the column. Hold your breath for 3-5 seconds or for as long as possible. Allow the piston or ball to fall to the bottom of the column. Remove the mouthpiece from your mouth and breathe out normally. Rest for a few seconds and repeat Steps 1 through 7 at least 10 times every 1-2 hours when you are awake. Take your time and take a few normal breaths between deep breaths. The spirometer may include an indicator to show your best effort. Use the indicator as a goal to work toward during each repetition. After each set of 10 deep breaths, practice coughing to be sure your lungs are clear. If you have an incision (the cut made at the time of surgery), support your incision when coughing by placing a pillow or rolled up towels firmly against it. Once you are able to get out of bed, walk around indoors and cough well. You may stop using the incentive spirometer when instructed by your caregiver.  RISKS AND COMPLICATIONS Take your time so you do not get dizzy or light-headed. If you are in pain, you may need to take or ask for pain medication before doing incentive spirometry. It is harder to take a deep breath if you are having pain. AFTER USE Rest and breathe slowly and easily. It can be helpful to keep track of a log of your progress. Your caregiver  can provide you with a simple table to help with this. If you are using the spirometer at home, follow these instructions: [place] IF:  You are having difficultly using the spirometer. You have trouble using the spirometer as often as instructed. Your pain medication is not  giving enough relief while using the spirometer. You develop fever of 100.5 F (38.1 C) or higher. SEEK IMMEDIATE MEDICAL CARE IF:  You cough up bloody sputum that had not been present before. You develop fever of 102 F (38.9 C) or greater. You develop worsening pain at or near the incision site. MAKE SURE YOU:  Understand these instructions. Will watch your condition. Will get help right away if you are not doing well or get worse. Document Released: 02/18/2007 Document Revised: 12/31/2011 Document Reviewed: 04/21/2007 [facility] Patient Information 2014 [employer], [place].   ________________________________________________________________________  ________________________________________________________________________

## 2021-05-17 NOTE — Progress Notes (Addendum)
PCP -  Cardiologist -  Pulm-- Peri Jefferson, FNP LOV 03-30-21 epic  PPM/ICD -  Device Orders -  Rep Notified -   Chest x-ray -  EKG -  Stress Test -  ECHO -  Cardiac Cath -  CBC/diff 04-20-21  Sleep Study -  CPAP -   Fasting Blood Sugar -  Checks Blood Sugar _____ times a day  Blood Thinner Instructions: Aspirin Instructions:  ERAS Protcol - PRE-SURGERY Ensure or G2-   COVID TEST- Ambulatory  Activity--- Anesthesia review: OSA Bipap  Patient denies shortness of breath, fever, cough and chest pain at PAT appointment   All instructions explained to the patient, with a verbal understanding of the material. Patient agrees to go over the instructions while at home for a better understanding. Patient also instructed to self quarantine after being tested for COVID-19. The opportunity to ask questions was provided.

## 2021-05-18 ENCOUNTER — Ambulatory Visit (HOSPITAL_COMMUNITY): Payer: Medicare Other | Admitting: Certified Registered Nurse Anesthetist

## 2021-05-18 ENCOUNTER — Ambulatory Visit (HOSPITAL_COMMUNITY)
Admission: RE | Admit: 2021-05-18 | Discharge: 2021-05-18 | Disposition: A | Discharge status: Home / Self Care | Payer: Medicare Other | Source: Ambulatory Visit | Attending: Orthopaedic Surgery | Admitting: Orthopaedic Surgery

## 2021-05-18 ENCOUNTER — Encounter (HOSPITAL_COMMUNITY): Payer: Self-pay | Admitting: Orthopaedic Surgery

## 2021-05-18 ENCOUNTER — Encounter (HOSPITAL_COMMUNITY)
Admission: RE | Disposition: A | Discharge status: Home / Self Care | Payer: Self-pay | Source: Ambulatory Visit | Attending: Orthopaedic Surgery

## 2021-05-18 ENCOUNTER — Ambulatory Visit (HOSPITAL_COMMUNITY): Payer: Medicare Other

## 2021-05-18 DIAGNOSIS — S8291XD Unspecified fracture of right lower leg, subsequent encounter for closed fracture with routine healing: Secondary | ICD-10-CM | POA: Diagnosis not present

## 2021-05-18 DIAGNOSIS — M25571 Pain in right ankle and joints of right foot: Secondary | ICD-10-CM | POA: Diagnosis present

## 2021-05-18 DIAGNOSIS — Z85820 Personal history of malignant melanoma of skin: Secondary | ICD-10-CM | POA: Diagnosis not present

## 2021-05-18 DIAGNOSIS — E559 Vitamin D deficiency, unspecified: Secondary | ICD-10-CM | POA: Diagnosis not present

## 2021-05-18 DIAGNOSIS — G8918 Other acute postprocedural pain: Secondary | ICD-10-CM | POA: Diagnosis not present

## 2021-05-18 DIAGNOSIS — Z79899 Other long term (current) drug therapy: Secondary | ICD-10-CM | POA: Insufficient documentation

## 2021-05-18 DIAGNOSIS — S93431A Sprain of tibiofibular ligament of right ankle, initial encounter: Secondary | ICD-10-CM | POA: Insufficient documentation

## 2021-05-18 DIAGNOSIS — S8261XA Displaced fracture of lateral malleolus of right fibula, initial encounter for closed fracture: Secondary | ICD-10-CM | POA: Insufficient documentation

## 2021-05-18 DIAGNOSIS — S8251XA Displaced fracture of medial malleolus of right tibia, initial encounter for closed fracture: Secondary | ICD-10-CM | POA: Diagnosis not present

## 2021-05-18 DIAGNOSIS — Z888 Allergy status to other drugs, medicaments and biological substances status: Secondary | ICD-10-CM | POA: Insufficient documentation

## 2021-05-18 DIAGNOSIS — W19XXXA Unspecified fall, initial encounter: Secondary | ICD-10-CM | POA: Insufficient documentation

## 2021-05-18 DIAGNOSIS — Z79890 Hormone replacement therapy (postmenopausal): Secondary | ICD-10-CM | POA: Insufficient documentation

## 2021-05-18 DIAGNOSIS — Z419 Encounter for procedure for purposes other than remedying health state, unspecified: Secondary | ICD-10-CM

## 2021-05-18 DIAGNOSIS — Z885 Allergy status to narcotic agent status: Secondary | ICD-10-CM | POA: Insufficient documentation

## 2021-05-18 HISTORY — PX: ORIF ANKLE FRACTURE: SHX5408

## 2021-05-18 SURGERY — OPEN REDUCTION INTERNAL FIXATION (ORIF) ANKLE FRACTURE
Anesthesia: Regional | Site: Ankle | Laterality: Right

## 2021-05-18 MED ORDER — FENTANYL CITRATE (PF) 100 MCG/2ML IJ SOLN
50.0000 ug | Freq: Once | INTRAMUSCULAR | Status: AC
  Administered 2021-05-18: 100 ug via INTRAVENOUS
  Filled 2021-05-18: qty 2

## 2021-05-18 MED ORDER — PROPOFOL 10 MG/ML IV BOLUS
INTRAVENOUS | Status: DC | PRN
  Administered 2021-05-18: 150 mg via INTRAVENOUS

## 2021-05-18 MED ORDER — ONDANSETRON HCL 4 MG/2ML IJ SOLN
INTRAMUSCULAR | Status: AC
  Filled 2021-05-18: qty 2

## 2021-05-18 MED ORDER — PROPOFOL 10 MG/ML IV BOLUS
INTRAVENOUS | Status: AC
  Filled 2021-05-18: qty 20

## 2021-05-18 MED ORDER — LIDOCAINE 2% (20 MG/ML) 5 ML SYRINGE
INTRAMUSCULAR | Status: AC
  Filled 2021-05-18: qty 5

## 2021-05-18 MED ORDER — ROPIVACAINE HCL 5 MG/ML IJ SOLN
INTRAMUSCULAR | Status: DC | PRN
  Administered 2021-05-18: 20 mL via PERINEURAL
  Administered 2021-05-18: 30 mL via PERINEURAL

## 2021-05-18 MED ORDER — DEXAMETHASONE SODIUM PHOSPHATE 10 MG/ML IJ SOLN
INTRAMUSCULAR | Status: DC | PRN
  Administered 2021-05-18 (×2): 5 mg

## 2021-05-18 MED ORDER — PHENYLEPHRINE 40 MCG/ML (10ML) SYRINGE FOR IV PUSH (FOR BLOOD PRESSURE SUPPORT)
PREFILLED_SYRINGE | INTRAVENOUS | Status: DC | PRN
  Administered 2021-05-18: 160 ug via INTRAVENOUS

## 2021-05-18 MED ORDER — LACTATED RINGERS IV SOLN: INTRAVENOUS | Status: DC

## 2021-05-18 MED ORDER — ROCURONIUM BROMIDE 10 MG/ML (PF) SYRINGE
PREFILLED_SYRINGE | INTRAVENOUS | Status: AC
  Filled 2021-05-18: qty 10

## 2021-05-18 MED ORDER — MIDAZOLAM HCL 2 MG/2ML IJ SOLN
1.0000 mg | Freq: Once | INTRAMUSCULAR | Status: AC
  Administered 2021-05-18: 1 mg via INTRAVENOUS
  Filled 2021-05-18: qty 2

## 2021-05-18 MED ORDER — ONDANSETRON HCL 4 MG/2ML IJ SOLN
INTRAMUSCULAR | Status: DC | PRN
  Administered 2021-05-18: 4 mg via INTRAVENOUS

## 2021-05-18 MED ORDER — CEFAZOLIN IN SODIUM CHLORIDE 3-0.9 GM/100ML-% IV SOLN
3.0000 g | INTRAVENOUS | Status: AC
  Administered 2021-05-18: 3 g via INTRAVENOUS
  Filled 2021-05-18: qty 100

## 2021-05-18 MED ORDER — PHENYLEPHRINE 40 MCG/ML (10ML) SYRINGE FOR IV PUSH (FOR BLOOD PRESSURE SUPPORT)
PREFILLED_SYRINGE | INTRAVENOUS | Status: AC
  Filled 2021-05-18: qty 20

## 2021-05-18 MED ORDER — LIDOCAINE 2% (20 MG/ML) 5 ML SYRINGE
INTRAMUSCULAR | Status: DC | PRN
  Administered 2021-05-18: 20 mg via INTRAVENOUS

## 2021-05-18 MED ORDER — 0.9 % SODIUM CHLORIDE (POUR BTL) OPTIME
TOPICAL | Status: DC | PRN
  Administered 2021-05-18: 1000 mL

## 2021-05-18 MED ORDER — ACETAMINOPHEN 500 MG PO TABS
1000.0000 mg | ORAL_TABLET | Freq: Once | ORAL | Status: AC
  Administered 2021-05-18: 1000 mg via ORAL
  Filled 2021-05-18: qty 2

## 2021-05-18 MED ORDER — DEXAMETHASONE SODIUM PHOSPHATE 10 MG/ML IJ SOLN
INTRAMUSCULAR | Status: DC | PRN
  Administered 2021-05-18: 10 mg via INTRAVENOUS

## 2021-05-18 MED ORDER — FENTANYL CITRATE (PF) 100 MCG/2ML IJ SOLN: 25.0000 ug | INTRAMUSCULAR | Status: DC | PRN

## 2021-05-18 SURGICAL SUPPLY — 67 items
APL PRP STRL LF DISP 70% ISPRP (MISCELLANEOUS) ×1
BAG COUNTER SPONGE SURGICOUNT (BAG) ×1 IMPLANT
BAG SPNG CNTER NS LX DISP (BAG) ×1
BANDAGE ESMARK 6X9 LF (GAUZE/BANDAGES/DRESSINGS) IMPLANT
BIT DRILL 2 CANN GRADUATED (BIT) ×1 IMPLANT
BIT DRILL 2.5 CANN STRL (BIT) ×1 IMPLANT
BIT DRILL 3 CANN ENDOSCOPIC (BIT) ×1 IMPLANT
BLADE SURG 15 STRL LF DISP TIS (BLADE) ×1 IMPLANT
BLADE SURG 15 STRL SS (BLADE) ×2
BNDG CMPR 9X6 STRL LF SNTH (GAUZE/BANDAGES/DRESSINGS)
BNDG CMPR MED 15X6 ELC VLCR LF (GAUZE/BANDAGES/DRESSINGS) ×1
BNDG COHESIVE 4X5 TAN STRL (GAUZE/BANDAGES/DRESSINGS) IMPLANT
BNDG ELASTIC 6X15 VLCR STRL LF (GAUZE/BANDAGES/DRESSINGS) ×1 IMPLANT
BNDG ELASTIC 6X5.8 VLCR STR LF (GAUZE/BANDAGES/DRESSINGS) ×4 IMPLANT
BNDG ESMARK 6X9 LF (GAUZE/BANDAGES/DRESSINGS)
CHLORAPREP W/TINT 26 (MISCELLANEOUS) ×2 IMPLANT
COVER MAYO STAND STRL (DRAPES) IMPLANT
CUFF TOURN SGL QUICK 34 (TOURNIQUET CUFF) ×2
CUFF TRNQT CYL 34X4.125X (TOURNIQUET CUFF) ×1 IMPLANT
DECANTER SPIKE VIAL GLASS SM (MISCELLANEOUS) IMPLANT
DRAPE C-ARM 42X120 X-RAY (DRAPES) ×1 IMPLANT
DRAPE C-ARMOR (DRAPES) ×1 IMPLANT
DRAPE U-SHAPE 47X51 STRL (DRAPES) ×2 IMPLANT
DRSG PAD ABDOMINAL 8X10 ST (GAUZE/BANDAGES/DRESSINGS) ×1 IMPLANT
ELECT REM PT RETURN 15FT ADLT (MISCELLANEOUS) ×2 IMPLANT
GAUZE SPONGE 4X4 12PLY STRL (GAUZE/BANDAGES/DRESSINGS) ×2 IMPLANT
GAUZE XEROFORM 1X8 LF (GAUZE/BANDAGES/DRESSINGS) ×2 IMPLANT
GLOVE SRG 8 PF TXTR STRL LF DI (GLOVE) ×1 IMPLANT
GLOVE SURG ENC TEXT LTX SZ7.5 (GLOVE) ×2 IMPLANT
GLOVE SURG UNDER POLY LF SZ8 (GLOVE) ×2
GOWN STRL REUS W/ TWL XL LVL3 (GOWN DISPOSABLE) ×1 IMPLANT
GOWN STRL REUS W/TWL XL LVL3 (GOWN DISPOSABLE) ×2
KIT BASIN OR (CUSTOM PROCEDURE TRAY) ×2 IMPLANT
NS IRRIG 1000ML POUR BTL (IV SOLUTION) ×2 IMPLANT
PACK ORTHO EXTREMITY (CUSTOM PROCEDURE TRAY) ×2 IMPLANT
PAD CAST 4YDX4 CTTN HI CHSV (CAST SUPPLIES) ×1 IMPLANT
PADDING CAST ABS 4INX4YD NS (CAST SUPPLIES) ×1
PADDING CAST ABS COTTON 4X4 ST (CAST SUPPLIES) IMPLANT
PADDING CAST COTTON 4X4 STRL (CAST SUPPLIES) ×2
PADDING CAST SYNTHETIC 4 (CAST SUPPLIES) ×2
PADDING CAST SYNTHETIC 4X4 STR (CAST SUPPLIES) ×2 IMPLANT
PENCIL SMOKE EVACUATOR (MISCELLANEOUS) ×2 IMPLANT
PLATE TITANIUM 6H RT FIB ANKLE (Plate) ×1 IMPLANT
SCREW LO-PRO TI 3.5X16MM (Screw) ×1 IMPLANT
SCREW LOCK TI QF 3X14 (Screw) ×1 IMPLANT
SCREW LOCKING VARIABLE 3.0X16 (Screw) ×3 IMPLANT
SCREW LP CORT 3.0X20 (Screw) ×1 IMPLANT
SCREW LP TI 3.5X14MM (Screw) ×1 IMPLANT
SCREW LP TITANIUM 3.5X44 (Screw) ×1 IMPLANT
SLEEVE SCD COMPRESS KNEE MED (STOCKING) ×2 IMPLANT
SPLINT FIBERGLASS 5X30 (CAST SUPPLIES) ×1 IMPLANT
SPLINT PLASTER CAST XFAST 5X30 (CAST SUPPLIES) ×20 IMPLANT
SPLINT PLASTER XFAST SET 5X30 (CAST SUPPLIES) ×20
SPONGE T-LAP 18X18 ~~LOC~~+RFID (SPONGE) ×1 IMPLANT
STAPLER VISISTAT 35W (STAPLE) ×1 IMPLANT
STRIP CLOSURE SKIN 1/2X4 (GAUZE/BANDAGES/DRESSINGS) IMPLANT
SUCTION FRAZIER HANDLE 10FR (MISCELLANEOUS) ×2
SUCTION TUBE FRAZIER 10FR DISP (MISCELLANEOUS) ×1 IMPLANT
SUT ETHILON 3 0 PS 1 (SUTURE) IMPLANT
SUT MNCRL AB 3-0 PS2 18 (SUTURE) ×2 IMPLANT
SUT PDS AB 2-0 CT2 27 (SUTURE) IMPLANT
SUT VIC AB 2-0 SH 27 (SUTURE)
SUT VIC AB 2-0 SH 27XBRD (SUTURE) IMPLANT
SUT VIC AB 3-0 FS2 27 (SUTURE) IMPLANT
TOWEL OR 17X26 10 PK STRL BLUE (TOWEL DISPOSABLE) ×2 IMPLANT
UNDERPAD 30X36 HEAVY ABSORB (UNDERPADS AND DIAPERS) ×2 IMPLANT
YANKAUER SUCT BULB TIP 10FT TU (MISCELLANEOUS) ×2 IMPLANT

## 2021-05-18 NOTE — Anesthesia Procedure Notes (Signed)
Anesthesia Regional Block: Popliteal block   Pre-Anesthetic Checklist: , timeout performed,  Correct Patient, Correct Site, Correct Laterality,  Correct Procedure, Correct Position, site marked,  Risks and benefits discussed,  Surgical consent,  Pre-op evaluation,  At surgeon's request and post-op pain management  Laterality: Right  Prep: Maximum Sterile Barrier Precautions used, chloraprep       Needles:  Injection technique: Single-shot  Needle Type: Echogenic Stimulator Needle     Needle Length: 9cm  Needle Gauge: 22     Additional Needles:   Procedures:,,,, ultrasound used (permanent image in chart),,    Narrative:  Start time: 05/18/2021 12:45 PM End time: 05/18/2021 12:49 PM Injection made incrementally with aspirations every 5 mL.  Performed by: Personally  Anesthesiologist: Freddrick March, MD  Additional Notes: Monitors applied. No increased pain on injection. No increased resistance to injection. Injection made in 5cc increments. Good needle visualization. Patient tolerated procedure well.

## 2021-05-18 NOTE — Progress Notes (Signed)
AssistedDr. Hulan Fray with right, ultrasound guided, popliteal, adductor canal block. Side rails up, monitors on throughout procedure. See vital signs in flow sheet. Tolerated Procedure well.

## 2021-05-18 NOTE — Anesthesia Procedure Notes (Signed)
Anesthesia Regional Block: Adductor canal block   Pre-Anesthetic Checklist: , timeout performed,  Correct Patient, Correct Site, Correct Laterality,  Correct Procedure, Correct Position, site marked,  Risks and benefits discussed,  Surgical consent,  Pre-op evaluation,  At surgeon's request and post-op pain management  Laterality: Right  Prep: Maximum Sterile Barrier Precautions used, chloraprep       Needles:  Injection technique: Single-shot  Needle Type: Echogenic Stimulator Needle     Needle Length: 9cm  Needle Gauge: 22     Additional Needles:   Procedures:,,,, ultrasound used (permanent image in chart),,    Narrative:  Start time: 05/18/2021 12:50 PM End time: 05/18/2021 12:55 PM Injection made incrementally with aspirations every 5 mL.  Performed by: Personally  Anesthesiologist: Freddrick March, MD  Additional Notes: Monitors applied. No increased pain on injection. No increased resistance to injection. Injection made in 5cc increments. Good needle visualization. Patient tolerated procedure well.

## 2021-05-18 NOTE — Anesthesia Procedure Notes (Signed)
Procedure Name: LMA Insertion Date/Time: 05/18/2021 1:40 PM Performed by: Myna Bright, CRNA Pre-anesthesia Checklist: Patient identified, Emergency Drugs available, Suction available and Patient being monitored Patient Re-evaluated:Patient Re-evaluated prior to induction Oxygen Delivery Method: Circle system utilized Preoxygenation: Pre-oxygenation with 100% oxygen Induction Type: IV induction Ventilation: Mask ventilation without difficulty LMA: LMA inserted LMA Size: 4.0 Number of attempts: 1 Placement Confirmation: positive ETCO2 and breath sounds checked- equal and bilateral Tube secured with: Tape Dental Injury: Teeth and Oropharynx as per pre-operative assessment

## 2021-05-18 NOTE — Anesthesia Postprocedure Evaluation (Signed)
Anesthesia Post Note  Patient: Yolanda Chavez  Procedure(s) Performed: OPEN TREATMENT OF RIGHT LATERAL MALLEOLUS, SYNDESMOSIS (Right: Ankle)     Patient location during evaluation: PACU Anesthesia Type: Regional Level of consciousness: awake and alert Pain management: pain level controlled Vital Signs Assessment: post-procedure vital signs reviewed and stable Respiratory status: spontaneous breathing, nonlabored ventilation, respiratory function stable and patient connected to nasal cannula oxygen Cardiovascular status: blood pressure returned to baseline and stable Postop Assessment: no apparent nausea or vomiting Anesthetic complications: no   No notable events documented.  Last Vitals:  Vitals:   05/18/21 1515 05/18/21 1530  BP: (!) 105/58 (!) 109/57  Pulse: 89 92  Resp: 15 15  Temp:    SpO2: 99% 93%    Last Pain:  Vitals:   05/18/21 1530  TempSrc:   PainSc: 0-No pain                 Barnet Glasgow

## 2021-05-18 NOTE — Discharge Instructions (Signed)
DR. Charolette [facility]ult[facility] ANKLE SURGERY POST-OP INSTRUCTIONS   Pain Management The numbing medicine and your leg will last around 18 hours, take a dose of your pain medicine as soon as you feel it wearing off to avoid rebound pain. Keep your foot elevated above heart level.  Make sure that your heel hangs free ('floats'). Take all prescribed medication as directed. If taking narcotic pain medication you may want to use an over-the-counter stool softener to avoid constipation. You may take over-the-counter NSAIDs (ibuprofen, naproxen, etc.) as well as over-the-counter acetaminophen as directed on the packaging as a supplement for your pain and may also use it to wean away from the prescription medication.  Activity Non-weightbearing Keep splint intact  First Postoperative Visit Your first postop visit will be at least 2 weeks after surgery.  This should be scheduled when you schedule surgery. If you do not have a postoperative visit scheduled please call [phone] to schedule an appointment. At the appointment your incision will be evaluated for suture removal, x-rays will be obtained if necessary.  General Instructions Swelling is very common after foot and ankle surgery.  It often takes 3 months for the foot and ankle to begin to feel comfortable.  Some amount of swelling will persist for 6-12 months. DO NOT change the dressing.  If there is a problem with the dressing (too tight, loose, gets wet, etc.) please contact Dr. Ariston Grandison's office. DO NOT get the dressing wet.  For showers you can use an over-the-counter cast cover or wrap a washcloth around the top of your dressing and then cover it with a plastic bag and tape it to your leg. DO NOT soak the incision (no tubs, pools, bath, etc.) until you have approval from Dr. Rhythm Wigfall.  Contact Dr. Adairs office or go to Emergency Room if: Temperature above 101 F. Increasing pain that is unresponsive to pain medication or elevation Excessive redness or  swelling in your foot Dressing problems - excessive bloody drainage, looseness or tightness, or if dressing gets wet Develop pain, swelling, warmth, or discoloration of your calf  

## 2021-05-18 NOTE — Op Note (Signed)
Yolanda Chavez female 70 y.o. 05/18/2021  PreOperative Diagnosis: Right lateral malleolus fracture  PostOperative Diagnosis: Right lateral malleolus fracture Syndesmotic disruption  PROCEDURE: Open treatment of right lateral malleolus Open treatment of syndesmosis Ankle stress view fluoroscopy  SURGEON: Melony Overly, MD  ASSISTANT: Jesse Martinique, PA-S  ANESTHESIA: General LMA with peripheral nerve blockade  FINDINGS: Displaced lateral malleolus fracture with syndesmotic disruption  IMPLANTS: Distal fibular locking plate, Arthrex  INDICATIONS:70 y.o. female had a fall and sustained the above injury.  She had displacement of her lateral malleolus and medial clear space widening.  There is concern for syndesmotic disruption.  Given the instability pattern of her fracture and her baseline ambulatory status she was indicated for surgery.   Patient understood the risks, benefits and alternatives to surgery which include but are not limited to wound healing complications, infection, nonunion, malunion, need for further surgery as well as damage to surrounding structures. They also understood the potential for continued pain in that there were no guarantees of acceptable outcome After weighing these risks the patient opted to proceed with surgery.  PROCEDURE: Patient was identified the preoperative holding area.  The right leg was marked myself.  Consent was signed myself and the patient.  Peripheral nerve block was performed by anesthesia.  Patient was taken to the operative suite and placed supine on the operative table.  General anesthesia was induced out difficulty.  Preoperative antibiotics were given.  Thigh tourniquet was placed on the operative thigh and a bump was placed under the hip. Bone foam was used.  All bony prominences well-padded.  Surgical timeout was performed.  The operative extremity was then elevated and the tourniquet inflated to 300 mmHg.    ORIF  Lateral malleolus: We began by making a longitudinal incision overlying the distal fibula.  This was taken sharply down through skin and subcutaneous tissue.  Blunt dissection was used to identify any branch of the superficial peroneal nerve which was not identified within the surgical field.  The incision was then taken sharply down to bone and the fracture site was identified.  The fracture site was mobilized.   The fracture site were cleaned with a rondure and curette of any fracture hematoma and callus formation.  Then the fracture of the fibula was reduced under direct visualization and held provisionally with a lobster claw.  Then fluoroscopy confirmed adequate reduction of the fibula at that time.  Then a combination of locking and nonlocking screws were used after placement of a lag screw across the fracture by technique.  This provided good stability of the distal fibula fracture.     Ankle stress view flouroscopy: Then under fluoroscopy the ankle was stressed using an anterior drawer, dorsiflex and external rotation moment and found that the syndesmosis was unstable.  We also tried talar tilt testing and the medial clear space was stable with regard to talar tilting.  Due to the syndesmotic widening it was deemed suitable to proceed with open treatment of the syndesmosis.    A separate deep incision was created anteriorly about the syndesmotic area.  The ligaments were disrupted.  There is gross instability on testing.  The syndesmosis was reduced under direct visualization.  It was held provisionally with a Weber clamp.  Then a single Quadra cortical screw was placed across the syndesmosis and tibia to fix the syndesmosis.  Appropriate screw length and position was confirmed on fluoroscopy.  After positioning and placement of the screw the syndesmosis was stable.  The wounds were  irrigated and the deep tissue was closed with a 3-0 nmonocryl  The subcuticular tissue was closed with 3-0 Monocryl  and the skin with staples.  Xeroform placed on the wounds as well as 4 x 4's and sterile she cotton.  She tolerated this well.  There were no complications.  She was awakened from anesthesia and taken recovery in stable condition.  POST OPERATIVE INSTRUCTIONS: Nonweightbearing on operative extremity Keep splint dry and limb elevated Call the office with concerns Follow-up in 2 weeks for splint removal, x-rays of the operative ankle, nonweightbearing and suture removal if appropriate.     TOURNIQUET TIME: 46 minutes  BLOOD LOSS:  Minimal         DRAINS: none         SPECIMEN: none       COMPLICATIONS:  * No complications entered in OR log *         Disposition: PACU - hemodynamically stable.         Condition: stable

## 2021-05-18 NOTE — H&P (Signed)
PREOPERATIVE H&P  Chief Complaint: Right ankle pain  HPI: Yolanda Chavez is a 70 y.o. female who presents for preoperative history and physical with a diagnosis of right displaced lateral malleolus with medial malleolar avulsion fracture consistent with deltoid ligament disruption.  She had medial clear space widening seen on x-ray at the time of injury.  She was placed in a splint in the emergency department and seen in my office.  She was indicated for surgery given the instability pattern of her fracture.  She is here today for surgery.. Symptoms are rated as moderate to severe, and have been worsening.  This is significantly impairing activities of daily living.  She has elected for surgical management.   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    gall stones     HERNIA REPAIR  07/2011   uh with PVP, complicated by infection, mesh removal and primary repair of hernia   JOINT REPLACEMENT     bilateral knee   latareces     MELANOMA EXCISION  may 2012   cancerous cells removed from  left side of nose    thumb surgery  1977   TUBAL LIGATION     Social History   Socioeconomic History   Marital status: Married    Spouse name: Clytee Majumdar   Number of children: Not on file   Years of education: Not on file   Highest education level: Not on file  Occupational History   Occupation: Retired  Tobacco Use   Smoking status: Never   Smokeless  tobacco: Never  Vaping Use   Vaping Use: Never used  Substance and Sexual Activity   Alcohol use: No   Drug use: No   Sexual activity: Not Currently  Other Topics Concern   Not on file  Social History Narrative   Not on file   Social Determinants of Health   Financial Resource Strain: Not on file  Food Insecurity: Not on file  Transportation Needs: Not on file  Physical Activity: Not on file  Stress: Not on file  Social Connections: Not on file   Family History  Problem Relation Age of Onset   Heart disease Mother        arrythmia   Cancer Mother    Anxiety disorder Mother    Cancer Father        leukemia   Depression Father    Diabetes Father    High blood pressure Father    Allergies  Allergen Reactions   Hydromorphone Other (See Comments)     THINKS SX'S WERE DUE TO ADDITIONAL MEDS. TOOK HYDROMORPHONE JULY 2017 WITHOUT PROBLEMS.   Adhesive [Tape] Itching and Rash   Prior to Admission medications   Medication Sig Start Date End Date Taking?  Authorizing Provider  diphenhydramine-acetaminophen (TYLENOL PM) 25-500 MG TABS tablet Take 2 tablets by mouth every 4 (four) hours.   Yes [provider]  divalproex (DEPAKOTE ER) 500 MG 24 hr tablet Take 500 mg by mouth at bedtime.   Yes [provider]  fluvoxaMINE (LUVOX) 100 MG tablet Take 100-200 mg by mouth See admin instructions. Take 100 mg int he morning and 200 mg at bedtime 04/19/14  Yes [provider]  gabapentin (NEURONTIN) 100 MG capsule Take 200 mg by mouth at bedtime. 04/05/14  Yes [provider]  levocetirizine (XYZAL) 5 MG tablet Take 5 mg by mouth every evening.   Yes [provider]  levothyroxine (SYNTHROID, LEVOTHROID) 50 MCG tablet Take 50 mcg by mouth daily before breakfast.  04/16/14  Yes [provider]  montelukast (SINGULAIR) 10 MG tablet Take 10 mg by mouth at bedtime.   Yes [provider]  olopatadine (PATANOL) 0.1 % ophthalmic solution  Place 1 drop into both eyes daily as needed for allergies.   Yes [provider]  oxyCODONE-acetaminophen (PERCOCET/ROXICET) 5-325 MG tablet Take 1 tablet by mouth every 6 (six) hours as needed for severe pain. Patient taking differently: Take 1 tablet by mouth 2 (two) times daily. 11 am and 11 pm 05/08/21  Yes Plunkett, Loree Fee, MD  QUEtiapine (SEROQUEL) 200 MG tablet Take 400 mg by mouth at bedtime.   Yes [provider]  VENTOLIN HFA 108 (90 Base) MCG/ACT inhaler Inhale 2 puffs into the lungs every 6 (six) hours as needed (Asthma). 01/31/16  Yes [provider]  oxyCODONE (OXY IR/ROXICODONE) 5 MG immediate release tablet Take 5 mg by mouth every 4 (four) hours as needed for moderate pain. 05/12/21   [provider]     Positive ROS: All other systems have been reviewed and were otherwise negative with the exception of those mentioned in the HPI and as above.  Physical Exam:  Vitals:   05/18/21 1051  BP: (!) 187/71  Pulse: 98  Resp: 18  Temp: 98.5 F (36.9 C)  SpO2: 100%   General: Alert, no acute distress Cardiovascular: No pedal edema Respiratory: No cyanosis, no use of accessory musculature GI: No organomegaly, abdomen is soft and non-tender Skin: No lesions in the area of chief complaint Neurologic: Sensation intact distally Psychiatric: Patient is competent for consent with normal mood and affect Lymphatic: No axillary or cervical lymphadenopathy  MUSCULOSKELETAL: Right leg in a short leg splint.  Swelling to the foot.  Sensation intact the dorsal and plantar distal foot.  No tenderness of the dorsal foot.  No tenderness proximal to the splint.  Assessment: Right bimalleolar equivalent ankle fracture with possible syndesmotic disruption   Plan: Plan for open treatment of her lateral malleolus with assessment of stability and possible syndesmotic fixation versus deltoid ligament reconstruction.  We discussed the risks, benefits and  alternatives of surgery which include but are not limited to wound healing complications, infection, nonunion, malunion, need for further surgery, damage to surrounding structures and continued pain.  They understand there is no guarantees to an acceptable outcome.  After weighing these risks they opted to proceed with surgery.     Erle Crocker, MD    05/18/2021 12:13 PM

## 2021-05-18 NOTE — Anesthesia Preprocedure Evaluation (Addendum)
Anesthesia Evaluation  Patient identified by MRN, date of birth, ID band Patient awake    Reviewed: Allergy & Precautions, NPO status , Patient's Chart, lab work & pertinent test results  Airway Mallampati: II  TM Distance: >3 FB Neck ROM: Full    Dental no notable dental hx. (+) Teeth Intact, Dental Advisory Given   Pulmonary asthma , sleep apnea and Continuous Positive Airway Pressure Ventilation ,    Pulmonary exam normal breath sounds clear to auscultation       Cardiovascular negative cardio ROS Normal cardiovascular exam Rhythm:Regular Rate:Normal     Neuro/Psych PSYCHIATRIC DISORDERS Anxiety Depression Bipolar Disorder negative neurological ROS     GI/Hepatic Neg liver ROS, GERD  Controlled,  Endo/Other  Hypothyroidism Morbid obesity (BMI 51)  Renal/GU negative Renal ROS  negative genitourinary   Musculoskeletal negative musculoskeletal ROS (+)   Abdominal   Peds  Hematology  (+) Blood dyscrasia (non-hodgkins lymphoma), ,   Anesthesia Other Findings   Reproductive/Obstetrics                            Anesthesia Physical Anesthesia Plan  ASA: 3  Anesthesia Plan: General and Regional   Post-op Pain Management:  Regional for Post-op pain   Induction: Intravenous  PONV Risk Score and Plan: 3 and Ondansetron, Dexamethasone and Midazolam  Airway Management Planned: LMA  Additional Equipment:   Intra-op Plan:   Post-operative Plan: Extubation in OR  Informed Consent: I have reviewed the patients History and Physical, chart, labs and discussed the procedure including the risks, benefits and alternatives for the proposed anesthesia with the patient or authorized representative who has indicated his/her understanding and acceptance.     Dental advisory given  Plan Discussed with: CRNA  Anesthesia Plan Comments:         Anesthesia Quick Evaluation

## 2021-05-18 NOTE — Transfer of Care (Signed)
Immediate Anesthesia Transfer of Care Note  Patient: Jerika Berendsen Atienza  Procedure(s) Performed: OPEN TREATMENT OF RIGHT LATERAL MALLEOLUS, SYNDESMOSIS (Right: Ankle)  Patient Location: PACU  Anesthesia Type:General  Level of Consciousness: awake, alert  and oriented  Airway & Oxygen Therapy: Patient Spontanous Breathing and Patient connected to nasal cannula oxygen  Post-op Assessment: Report given to RN and Post -op Vital signs reviewed and stable  Post vital signs: Reviewed and stable  Last Vitals:  Vitals Value Taken Time  BP 132/58 05/18/21 1455  Temp    Pulse 90 05/18/21 1456  Resp 17 05/18/21 1456  SpO2 96 % 05/18/21 1456  Vitals shown include unvalidated device data.  Last Pain:  Vitals:   05/18/21 1256  TempSrc:   PainSc: 0-No pain         Complications: No notable events documented.

## 2021-05-18 NOTE — Progress Notes (Signed)
Orthopedic Tech Progress Note Patient Details:  Yolanda Chavez 04/20/1951 [id number]  Patient ID: Yolanda Chavez, female   DOB: 16-Jan-1951, 70 y.o.   MRN: [id number]  Yolanda Chavez 05/18/2021, 4:00 PM Crutches sized and given tp pt in pacu

## 2021-05-19 ENCOUNTER — Encounter (HOSPITAL_COMMUNITY): Payer: Self-pay | Admitting: Orthopaedic Surgery

## 2021-06-01 DIAGNOSIS — Z13820 Encounter for screening for osteoporosis: Secondary | ICD-10-CM | POA: Diagnosis not present

## 2021-06-05 DIAGNOSIS — S8251XA Displaced fracture of medial malleolus of right tibia, initial encounter for closed fracture: Secondary | ICD-10-CM | POA: Diagnosis not present

## 2021-06-19 DIAGNOSIS — S82891S Other fracture of right lower leg, sequela: Secondary | ICD-10-CM | POA: Diagnosis not present

## 2021-06-22 ENCOUNTER — Other Ambulatory Visit: Payer: Self-pay | Admitting: Nurse Practitioner

## 2021-06-22 DIAGNOSIS — S82891S Other fracture of right lower leg, sequela: Secondary | ICD-10-CM

## 2021-07-05 DIAGNOSIS — S82841A Displaced bimalleolar fracture of right lower leg, initial encounter for closed fracture: Secondary | ICD-10-CM | POA: Diagnosis not present

## 2021-07-06 DIAGNOSIS — M6281 Muscle weakness (generalized): Secondary | ICD-10-CM | POA: Diagnosis not present

## 2021-07-06 DIAGNOSIS — S82841D Displaced bimalleolar fracture of right lower leg, subsequent encounter for closed fracture with routine healing: Secondary | ICD-10-CM | POA: Diagnosis not present

## 2021-07-06 DIAGNOSIS — M25671 Stiffness of right ankle, not elsewhere classified: Secondary | ICD-10-CM | POA: Diagnosis not present

## 2021-07-11 DIAGNOSIS — M6281 Muscle weakness (generalized): Secondary | ICD-10-CM | POA: Diagnosis not present

## 2021-07-11 DIAGNOSIS — S82841D Displaced bimalleolar fracture of right lower leg, subsequent encounter for closed fracture with routine healing: Secondary | ICD-10-CM | POA: Diagnosis not present

## 2021-07-11 DIAGNOSIS — M25671 Stiffness of right ankle, not elsewhere classified: Secondary | ICD-10-CM | POA: Diagnosis not present

## 2021-07-19 DIAGNOSIS — S82841D Displaced bimalleolar fracture of right lower leg, subsequent encounter for closed fracture with routine healing: Secondary | ICD-10-CM | POA: Diagnosis not present

## 2021-07-19 DIAGNOSIS — M25671 Stiffness of right ankle, not elsewhere classified: Secondary | ICD-10-CM | POA: Diagnosis not present

## 2021-07-19 DIAGNOSIS — M6281 Muscle weakness (generalized): Secondary | ICD-10-CM | POA: Diagnosis not present

## 2021-07-25 DIAGNOSIS — M25671 Stiffness of right ankle, not elsewhere classified: Secondary | ICD-10-CM | POA: Diagnosis not present

## 2021-07-25 DIAGNOSIS — S82841D Displaced bimalleolar fracture of right lower leg, subsequent encounter for closed fracture with routine healing: Secondary | ICD-10-CM | POA: Diagnosis not present

## 2021-07-25 DIAGNOSIS — M6281 Muscle weakness (generalized): Secondary | ICD-10-CM | POA: Diagnosis not present

## 2021-07-27 DIAGNOSIS — S82841D Displaced bimalleolar fracture of right lower leg, subsequent encounter for closed fracture with routine healing: Secondary | ICD-10-CM | POA: Diagnosis not present

## 2021-07-27 DIAGNOSIS — M6281 Muscle weakness (generalized): Secondary | ICD-10-CM | POA: Diagnosis not present

## 2021-07-27 DIAGNOSIS — M25671 Stiffness of right ankle, not elsewhere classified: Secondary | ICD-10-CM | POA: Diagnosis not present

## 2021-08-01 DIAGNOSIS — M6281 Muscle weakness (generalized): Secondary | ICD-10-CM | POA: Diagnosis not present

## 2021-08-01 DIAGNOSIS — S82841D Displaced bimalleolar fracture of right lower leg, subsequent encounter for closed fracture with routine healing: Secondary | ICD-10-CM | POA: Diagnosis not present

## 2021-08-01 DIAGNOSIS — M25671 Stiffness of right ankle, not elsewhere classified: Secondary | ICD-10-CM | POA: Diagnosis not present

## 2021-08-03 DIAGNOSIS — M6281 Muscle weakness (generalized): Secondary | ICD-10-CM | POA: Diagnosis not present

## 2021-08-03 DIAGNOSIS — M25671 Stiffness of right ankle, not elsewhere classified: Secondary | ICD-10-CM | POA: Diagnosis not present

## 2021-08-03 DIAGNOSIS — S82841D Displaced bimalleolar fracture of right lower leg, subsequent encounter for closed fracture with routine healing: Secondary | ICD-10-CM | POA: Diagnosis not present

## 2021-08-08 DIAGNOSIS — S82841D Displaced bimalleolar fracture of right lower leg, subsequent encounter for closed fracture with routine healing: Secondary | ICD-10-CM | POA: Diagnosis not present

## 2021-08-08 DIAGNOSIS — M6281 Muscle weakness (generalized): Secondary | ICD-10-CM | POA: Diagnosis not present

## 2021-08-08 DIAGNOSIS — M25671 Stiffness of right ankle, not elsewhere classified: Secondary | ICD-10-CM | POA: Diagnosis not present

## 2021-08-10 DIAGNOSIS — M6281 Muscle weakness (generalized): Secondary | ICD-10-CM | POA: Diagnosis not present

## 2021-08-10 DIAGNOSIS — M25671 Stiffness of right ankle, not elsewhere classified: Secondary | ICD-10-CM | POA: Diagnosis not present

## 2021-08-10 DIAGNOSIS — S82841D Displaced bimalleolar fracture of right lower leg, subsequent encounter for closed fracture with routine healing: Secondary | ICD-10-CM | POA: Diagnosis not present

## 2021-08-15 DIAGNOSIS — S82841D Displaced bimalleolar fracture of right lower leg, subsequent encounter for closed fracture with routine healing: Secondary | ICD-10-CM | POA: Diagnosis not present

## 2021-08-15 DIAGNOSIS — M25671 Stiffness of right ankle, not elsewhere classified: Secondary | ICD-10-CM | POA: Diagnosis not present

## 2021-08-15 DIAGNOSIS — M6281 Muscle weakness (generalized): Secondary | ICD-10-CM | POA: Diagnosis not present

## 2021-08-16 DIAGNOSIS — M25671 Stiffness of right ankle, not elsewhere classified: Secondary | ICD-10-CM | POA: Diagnosis not present

## 2021-08-17 DIAGNOSIS — S82841D Displaced bimalleolar fracture of right lower leg, subsequent encounter for closed fracture with routine healing: Secondary | ICD-10-CM | POA: Diagnosis not present

## 2021-08-17 DIAGNOSIS — M6281 Muscle weakness (generalized): Secondary | ICD-10-CM | POA: Diagnosis not present

## 2021-08-17 DIAGNOSIS — M25671 Stiffness of right ankle, not elsewhere classified: Secondary | ICD-10-CM | POA: Diagnosis not present

## 2021-08-22 DIAGNOSIS — M25671 Stiffness of right ankle, not elsewhere classified: Secondary | ICD-10-CM | POA: Diagnosis not present

## 2021-08-22 DIAGNOSIS — M6281 Muscle weakness (generalized): Secondary | ICD-10-CM | POA: Diagnosis not present

## 2021-08-22 DIAGNOSIS — S82841D Displaced bimalleolar fracture of right lower leg, subsequent encounter for closed fracture with routine healing: Secondary | ICD-10-CM | POA: Diagnosis not present

## 2021-08-24 DIAGNOSIS — M6281 Muscle weakness (generalized): Secondary | ICD-10-CM | POA: Diagnosis not present

## 2021-08-24 DIAGNOSIS — Z23 Encounter for immunization: Secondary | ICD-10-CM | POA: Diagnosis not present

## 2021-08-24 DIAGNOSIS — E039 Hypothyroidism, unspecified: Secondary | ICD-10-CM | POA: Diagnosis not present

## 2021-08-24 DIAGNOSIS — M25671 Stiffness of right ankle, not elsewhere classified: Secondary | ICD-10-CM | POA: Diagnosis not present

## 2021-08-24 DIAGNOSIS — S82841D Displaced bimalleolar fracture of right lower leg, subsequent encounter for closed fracture with routine healing: Secondary | ICD-10-CM | POA: Diagnosis not present

## 2021-08-29 DIAGNOSIS — M6281 Muscle weakness (generalized): Secondary | ICD-10-CM | POA: Diagnosis not present

## 2021-08-29 DIAGNOSIS — M25671 Stiffness of right ankle, not elsewhere classified: Secondary | ICD-10-CM | POA: Diagnosis not present

## 2021-08-29 DIAGNOSIS — S82841D Displaced bimalleolar fracture of right lower leg, subsequent encounter for closed fracture with routine healing: Secondary | ICD-10-CM | POA: Diagnosis not present

## 2021-08-31 DIAGNOSIS — S82841D Displaced bimalleolar fracture of right lower leg, subsequent encounter for closed fracture with routine healing: Secondary | ICD-10-CM | POA: Diagnosis not present

## 2021-08-31 DIAGNOSIS — M25671 Stiffness of right ankle, not elsewhere classified: Secondary | ICD-10-CM | POA: Diagnosis not present

## 2021-08-31 DIAGNOSIS — M6281 Muscle weakness (generalized): Secondary | ICD-10-CM | POA: Diagnosis not present

## 2021-09-04 DIAGNOSIS — G4733 Obstructive sleep apnea (adult) (pediatric): Secondary | ICD-10-CM | POA: Diagnosis not present

## 2021-09-05 DIAGNOSIS — M25671 Stiffness of right ankle, not elsewhere classified: Secondary | ICD-10-CM | POA: Diagnosis not present

## 2021-09-05 DIAGNOSIS — M6281 Muscle weakness (generalized): Secondary | ICD-10-CM | POA: Diagnosis not present

## 2021-09-05 DIAGNOSIS — S82841D Displaced bimalleolar fracture of right lower leg, subsequent encounter for closed fracture with routine healing: Secondary | ICD-10-CM | POA: Diagnosis not present

## 2021-09-07 DIAGNOSIS — S82841D Displaced bimalleolar fracture of right lower leg, subsequent encounter for closed fracture with routine healing: Secondary | ICD-10-CM | POA: Diagnosis not present

## 2021-09-07 DIAGNOSIS — M25671 Stiffness of right ankle, not elsewhere classified: Secondary | ICD-10-CM | POA: Diagnosis not present

## 2021-09-07 DIAGNOSIS — M6281 Muscle weakness (generalized): Secondary | ICD-10-CM | POA: Diagnosis not present

## 2021-09-11 DIAGNOSIS — H40033 Anatomical narrow angle, bilateral: Secondary | ICD-10-CM | POA: Diagnosis not present

## 2021-09-11 DIAGNOSIS — H2513 Age-related nuclear cataract, bilateral: Secondary | ICD-10-CM | POA: Diagnosis not present

## 2021-09-25 DIAGNOSIS — K219 Gastro-esophageal reflux disease without esophagitis: Secondary | ICD-10-CM | POA: Diagnosis not present

## 2021-09-25 DIAGNOSIS — J300 Vasomotor rhinitis: Secondary | ICD-10-CM | POA: Diagnosis not present

## 2021-09-25 DIAGNOSIS — R059 Cough, unspecified: Secondary | ICD-10-CM | POA: Diagnosis not present

## 2021-10-02 DIAGNOSIS — J209 Acute bronchitis, unspecified: Secondary | ICD-10-CM | POA: Diagnosis not present

## 2021-10-02 DIAGNOSIS — J040 Acute laryngitis: Secondary | ICD-10-CM | POA: Diagnosis not present

## 2021-10-04 DIAGNOSIS — G4733 Obstructive sleep apnea (adult) (pediatric): Secondary | ICD-10-CM | POA: Diagnosis not present

## 2021-10-19 DIAGNOSIS — D696 Thrombocytopenia, unspecified: Secondary | ICD-10-CM | POA: Diagnosis not present

## 2021-10-19 DIAGNOSIS — G4733 Obstructive sleep apnea (adult) (pediatric): Secondary | ICD-10-CM | POA: Diagnosis not present

## 2021-10-19 DIAGNOSIS — C8338 Diffuse large B-cell lymphoma, lymph nodes of multiple sites: Secondary | ICD-10-CM | POA: Diagnosis not present

## 2021-11-04 DIAGNOSIS — G4733 Obstructive sleep apnea (adult) (pediatric): Secondary | ICD-10-CM | POA: Diagnosis not present

## 2021-11-23 DIAGNOSIS — H8110 Benign paroxysmal vertigo, unspecified ear: Secondary | ICD-10-CM | POA: Diagnosis not present

## 2021-12-05 DIAGNOSIS — G4733 Obstructive sleep apnea (adult) (pediatric): Secondary | ICD-10-CM | POA: Diagnosis not present

## 2021-12-12 ENCOUNTER — Ambulatory Visit
Admission: RE | Admit: 2021-12-12 | Discharge: 2021-12-12 | Disposition: A | Discharge status: Home / Self Care | Payer: Medicare Other | Source: Ambulatory Visit | Attending: Nurse Practitioner | Admitting: Nurse Practitioner

## 2021-12-12 DIAGNOSIS — M85852 Other specified disorders of bone density and structure, left thigh: Secondary | ICD-10-CM | POA: Diagnosis not present

## 2021-12-12 DIAGNOSIS — S82891S Other fracture of right lower leg, sequela: Secondary | ICD-10-CM

## 2021-12-27 ENCOUNTER — Other Ambulatory Visit: Payer: Self-pay | Admitting: Family Medicine

## 2021-12-27 DIAGNOSIS — Z1231 Encounter for screening mammogram for malignant neoplasm of breast: Secondary | ICD-10-CM

## 2022-01-02 DIAGNOSIS — G4733 Obstructive sleep apnea (adult) (pediatric): Secondary | ICD-10-CM | POA: Diagnosis not present

## 2022-02-02 DIAGNOSIS — G4733 Obstructive sleep apnea (adult) (pediatric): Secondary | ICD-10-CM | POA: Diagnosis not present

## 2022-02-12 ENCOUNTER — Ambulatory Visit
Admission: RE | Admit: 2022-02-12 | Discharge: 2022-02-12 | Disposition: A | Discharge status: Home / Self Care | Payer: Medicare Other | Source: Ambulatory Visit | Attending: Family Medicine | Admitting: Family Medicine

## 2022-02-12 DIAGNOSIS — Z1231 Encounter for screening mammogram for malignant neoplasm of breast: Secondary | ICD-10-CM | POA: Diagnosis not present

## 2022-02-14 DIAGNOSIS — G4733 Obstructive sleep apnea (adult) (pediatric): Secondary | ICD-10-CM | POA: Diagnosis not present

## 2022-02-28 DIAGNOSIS — M545 Low back pain, unspecified: Secondary | ICD-10-CM | POA: Diagnosis not present

## 2022-03-04 DIAGNOSIS — G4733 Obstructive sleep apnea (adult) (pediatric): Secondary | ICD-10-CM | POA: Diagnosis not present

## 2022-03-14 DIAGNOSIS — G5702 Lesion of sciatic nerve, left lower limb: Secondary | ICD-10-CM | POA: Diagnosis not present

## 2022-03-22 DIAGNOSIS — S3992XA Unspecified injury of lower back, initial encounter: Secondary | ICD-10-CM | POA: Diagnosis not present

## 2022-03-22 DIAGNOSIS — M5136 Other intervertebral disc degeneration, lumbar region: Secondary | ICD-10-CM | POA: Diagnosis not present

## 2022-03-22 DIAGNOSIS — M545 Low back pain, unspecified: Secondary | ICD-10-CM | POA: Diagnosis not present

## 2022-03-22 DIAGNOSIS — M5137 Other intervertebral disc degeneration, lumbosacral region: Secondary | ICD-10-CM | POA: Diagnosis not present

## 2022-03-22 DIAGNOSIS — M47816 Spondylosis without myelopathy or radiculopathy, lumbar region: Secondary | ICD-10-CM | POA: Diagnosis not present

## 2022-03-22 DIAGNOSIS — M47817 Spondylosis without myelopathy or radiculopathy, lumbosacral region: Secondary | ICD-10-CM | POA: Diagnosis not present

## 2022-03-22 DIAGNOSIS — Z8571 Personal history of Hodgkin lymphoma: Secondary | ICD-10-CM | POA: Diagnosis not present

## 2022-04-04 DIAGNOSIS — G4733 Obstructive sleep apnea (adult) (pediatric): Secondary | ICD-10-CM | POA: Diagnosis not present

## 2022-04-10 DIAGNOSIS — G4733 Obstructive sleep apnea (adult) (pediatric): Secondary | ICD-10-CM | POA: Diagnosis not present

## 2022-05-04 DIAGNOSIS — G4733 Obstructive sleep apnea (adult) (pediatric): Secondary | ICD-10-CM | POA: Diagnosis not present

## 2022-05-30 ENCOUNTER — Encounter (INDEPENDENT_AMBULATORY_CARE_PROVIDER_SITE_OTHER): Payer: Self-pay

## 2022-05-30 DIAGNOSIS — U071 COVID-19: Secondary | ICD-10-CM | POA: Diagnosis not present

## 2022-06-04 DIAGNOSIS — G4733 Obstructive sleep apnea (adult) (pediatric): Secondary | ICD-10-CM | POA: Diagnosis not present

## 2022-07-04 ENCOUNTER — Telehealth: Payer: Self-pay | Admitting: *Deleted

## 2022-07-04 NOTE — Patient Outreach (Signed)
  Care Coordination   07/04/2022 Name: Yolanda Chavez MRN: [id number] DOB: October 24, 1950   Care Coordination Outreach Attempts:  An unsuccessful telephone outreach was attempted today to offer the patient information about available care coordination services as a benefit of their health plan.   Follow Up Plan:  Additional outreach attempts will be made to offer the patient care coordination information and services.   Encounter Outcome:  No Answer  Care Coordination Interventions Activated:  No   Care Coordination Interventions:  No, not indicated    Raina Mina, RN Care Management Coordinator [place] Office [phone]

## 2022-07-23 ENCOUNTER — Telehealth: Payer: Self-pay | Admitting: *Deleted

## 2022-07-23 NOTE — Patient Outreach (Signed)
  Care Coordination   07/23/2022 Name: Yolanda Chavez MRN: [id number] DOB: 1951-01-03   Care Coordination Outreach Attempts:  A second unsuccessful outreach was attempted today to offer the patient with information about available care coordination services as a benefit of their health plan.     Follow Up Plan:  Additional outreach attempts will be made to offer the patient care coordination information and services.   Encounter Outcome:  No Answer  Care Coordination Interventions Activated:  No   Care Coordination Interventions:  No, not indicated    Raina Mina, RN Care Management Coordinator [place] Office [phone]

## 2022-07-24 DIAGNOSIS — G4733 Obstructive sleep apnea (adult) (pediatric): Secondary | ICD-10-CM | POA: Diagnosis not present

## 2022-08-02 ENCOUNTER — Telehealth: Payer: Self-pay

## 2022-08-02 NOTE — Patient Outreach (Signed)
  Care Coordination   08/02/2022 Name: Yolanda Chavez MRN: [id number] DOB: May 08, 1951   Care Coordination Outreach Attempts:  A third unsuccessful outreach was attempted today to offer the patient with information about available care coordination services as a benefit of their health plan.   Follow Up Plan:  No further outreach attempts will be made at this time. We have been unable to contact the patient to offer or enroll patient in care coordination services  Encounter Outcome:  No Answer  Care Coordination Interventions Activated:  No   Care Coordination Interventions:  No, not indicated   Peter Garter RN, BSN,CCM, [facility] [phone]

## 2022-09-25 DIAGNOSIS — R058 Other specified cough: Secondary | ICD-10-CM | POA: Diagnosis not present

## 2022-09-25 DIAGNOSIS — J300 Vasomotor rhinitis: Secondary | ICD-10-CM | POA: Diagnosis not present

## 2022-09-25 DIAGNOSIS — K219 Gastro-esophageal reflux disease without esophagitis: Secondary | ICD-10-CM | POA: Diagnosis not present

## 2022-10-02 DIAGNOSIS — D229 Melanocytic nevi, unspecified: Secondary | ICD-10-CM | POA: Diagnosis not present

## 2022-10-02 DIAGNOSIS — M25512 Pain in left shoulder: Secondary | ICD-10-CM | POA: Diagnosis not present

## 2022-10-18 DIAGNOSIS — C8338 Diffuse large B-cell lymphoma, lymph nodes of multiple sites: Secondary | ICD-10-CM | POA: Diagnosis not present

## 2022-10-18 DIAGNOSIS — L989 Disorder of the skin and subcutaneous tissue, unspecified: Secondary | ICD-10-CM | POA: Diagnosis not present

## 2022-10-18 DIAGNOSIS — G4733 Obstructive sleep apnea (adult) (pediatric): Secondary | ICD-10-CM | POA: Diagnosis not present

## 2022-10-18 DIAGNOSIS — D696 Thrombocytopenia, unspecified: Secondary | ICD-10-CM | POA: Diagnosis not present

## 2022-11-22 DIAGNOSIS — R208 Other disturbances of skin sensation: Secondary | ICD-10-CM | POA: Diagnosis not present

## 2022-11-22 DIAGNOSIS — L538 Other specified erythematous conditions: Secondary | ICD-10-CM | POA: Diagnosis not present

## 2022-11-22 DIAGNOSIS — L298 Other pruritus: Secondary | ICD-10-CM | POA: Diagnosis not present

## 2022-11-22 DIAGNOSIS — L820 Inflamed seborrheic keratosis: Secondary | ICD-10-CM | POA: Diagnosis not present

## 2022-11-22 DIAGNOSIS — Z789 Other specified health status: Secondary | ICD-10-CM | POA: Diagnosis not present

## 2022-12-18 DIAGNOSIS — R208 Other disturbances of skin sensation: Secondary | ICD-10-CM | POA: Diagnosis not present

## 2022-12-18 DIAGNOSIS — G4733 Obstructive sleep apnea (adult) (pediatric): Secondary | ICD-10-CM | POA: Diagnosis not present

## 2022-12-18 DIAGNOSIS — L298 Other pruritus: Secondary | ICD-10-CM | POA: Diagnosis not present

## 2022-12-18 DIAGNOSIS — Z789 Other specified health status: Secondary | ICD-10-CM | POA: Diagnosis not present

## 2022-12-18 DIAGNOSIS — L820 Inflamed seborrheic keratosis: Secondary | ICD-10-CM | POA: Diagnosis not present

## 2022-12-18 DIAGNOSIS — L538 Other specified erythematous conditions: Secondary | ICD-10-CM | POA: Diagnosis not present

## 2023-01-16 ENCOUNTER — Other Ambulatory Visit: Payer: Self-pay | Admitting: Family Medicine

## 2023-01-16 DIAGNOSIS — Z1231 Encounter for screening mammogram for malignant neoplasm of breast: Secondary | ICD-10-CM

## 2023-02-28 ENCOUNTER — Ambulatory Visit
Admission: RE | Admit: 2023-02-28 | Discharge: 2023-02-28 | Disposition: A | Discharge status: Home / Self Care | Payer: Medicare Other | Source: Ambulatory Visit | Attending: Family Medicine | Admitting: Family Medicine

## 2023-02-28 DIAGNOSIS — Z1231 Encounter for screening mammogram for malignant neoplasm of breast: Secondary | ICD-10-CM | POA: Diagnosis not present

## 2023-03-19 DIAGNOSIS — G4733 Obstructive sleep apnea (adult) (pediatric): Secondary | ICD-10-CM | POA: Diagnosis not present

## 2023-04-01 DIAGNOSIS — Z885 Allergy status to narcotic agent status: Secondary | ICD-10-CM | POA: Diagnosis not present

## 2023-04-01 DIAGNOSIS — K635 Polyp of colon: Secondary | ICD-10-CM | POA: Diagnosis not present

## 2023-04-01 DIAGNOSIS — E039 Hypothyroidism, unspecified: Secondary | ICD-10-CM | POA: Diagnosis not present

## 2023-04-01 DIAGNOSIS — K5730 Diverticulosis of large intestine without perforation or abscess without bleeding: Secondary | ICD-10-CM | POA: Diagnosis not present

## 2023-04-01 DIAGNOSIS — G4733 Obstructive sleep apnea (adult) (pediatric): Secondary | ICD-10-CM | POA: Diagnosis not present

## 2023-04-01 DIAGNOSIS — K6289 Other specified diseases of anus and rectum: Secondary | ICD-10-CM | POA: Diagnosis not present

## 2023-04-01 DIAGNOSIS — Z79899 Other long term (current) drug therapy: Secondary | ICD-10-CM | POA: Diagnosis not present

## 2023-04-01 DIAGNOSIS — K8012 Calculus of gallbladder with acute and chronic cholecystitis without obstruction: Secondary | ICD-10-CM | POA: Diagnosis not present

## 2023-04-01 DIAGNOSIS — K648 Other hemorrhoids: Secondary | ICD-10-CM | POA: Diagnosis not present

## 2023-04-01 DIAGNOSIS — Z1211 Encounter for screening for malignant neoplasm of colon: Secondary | ICD-10-CM | POA: Diagnosis not present

## 2023-04-01 DIAGNOSIS — K219 Gastro-esophageal reflux disease without esophagitis: Secondary | ICD-10-CM | POA: Diagnosis not present

## 2023-04-01 DIAGNOSIS — D123 Benign neoplasm of transverse colon: Secondary | ICD-10-CM | POA: Diagnosis not present

## 2023-04-01 DIAGNOSIS — K449 Diaphragmatic hernia without obstruction or gangrene: Secondary | ICD-10-CM | POA: Diagnosis not present

## 2023-04-01 DIAGNOSIS — Z800 Family history of malignant neoplasm of digestive organs: Secondary | ICD-10-CM | POA: Diagnosis not present

## 2023-04-01 DIAGNOSIS — Z86010 Personal history of colonic polyps: Secondary | ICD-10-CM | POA: Diagnosis not present

## 2023-04-01 DIAGNOSIS — Z8572 Personal history of non-Hodgkin lymphomas: Secondary | ICD-10-CM | POA: Diagnosis not present

## 2023-04-01 DIAGNOSIS — Z96653 Presence of artificial knee joint, bilateral: Secondary | ICD-10-CM | POA: Diagnosis not present

## 2023-04-01 DIAGNOSIS — Z91048 Other nonmedicinal substance allergy status: Secondary | ICD-10-CM | POA: Diagnosis not present

## 2023-04-01 DIAGNOSIS — M1990 Unspecified osteoarthritis, unspecified site: Secondary | ICD-10-CM | POA: Diagnosis not present

## 2023-04-17 DIAGNOSIS — G4733 Obstructive sleep apnea (adult) (pediatric): Secondary | ICD-10-CM | POA: Diagnosis not present

## 2023-06-12 DIAGNOSIS — Z9181 History of falling: Secondary | ICD-10-CM | POA: Diagnosis not present

## 2023-06-12 DIAGNOSIS — C8510 Unspecified B-cell lymphoma, unspecified site: Secondary | ICD-10-CM | POA: Diagnosis not present

## 2023-06-12 DIAGNOSIS — G4733 Obstructive sleep apnea (adult) (pediatric): Secondary | ICD-10-CM | POA: Diagnosis not present

## 2023-06-12 DIAGNOSIS — E039 Hypothyroidism, unspecified: Secondary | ICD-10-CM | POA: Diagnosis not present

## 2023-06-12 DIAGNOSIS — E7800 Pure hypercholesterolemia, unspecified: Secondary | ICD-10-CM | POA: Diagnosis not present

## 2023-06-12 DIAGNOSIS — Z0000 Encounter for general adult medical examination without abnormal findings: Secondary | ICD-10-CM | POA: Diagnosis not present

## 2023-06-12 DIAGNOSIS — M8589 Other specified disorders of bone density and structure, multiple sites: Secondary | ICD-10-CM | POA: Diagnosis not present

## 2023-06-12 DIAGNOSIS — Z23 Encounter for immunization: Secondary | ICD-10-CM | POA: Diagnosis not present

## 2023-06-13 DIAGNOSIS — R7301 Impaired fasting glucose: Secondary | ICD-10-CM | POA: Diagnosis not present

## 2023-08-14 DIAGNOSIS — L308 Other specified dermatitis: Secondary | ICD-10-CM | POA: Diagnosis not present

## 2023-10-21 DIAGNOSIS — R110 Nausea: Secondary | ICD-10-CM | POA: Diagnosis not present

## 2023-10-21 DIAGNOSIS — C8338 Diffuse large B-cell lymphoma, lymph nodes of multiple sites: Secondary | ICD-10-CM | POA: Diagnosis not present

## 2023-10-21 DIAGNOSIS — D696 Thrombocytopenia, unspecified: Secondary | ICD-10-CM | POA: Diagnosis not present

## 2023-10-24 DIAGNOSIS — R112 Nausea with vomiting, unspecified: Secondary | ICD-10-CM | POA: Diagnosis not present

## 2023-10-28 DIAGNOSIS — J300 Vasomotor rhinitis: Secondary | ICD-10-CM | POA: Diagnosis not present

## 2023-10-28 DIAGNOSIS — R058 Other specified cough: Secondary | ICD-10-CM | POA: Diagnosis not present

## 2023-10-28 DIAGNOSIS — K219 Gastro-esophageal reflux disease without esophagitis: Secondary | ICD-10-CM | POA: Diagnosis not present

## 2024-02-05 ENCOUNTER — Other Ambulatory Visit: Payer: Self-pay | Admitting: Family Medicine

## 2024-02-05 DIAGNOSIS — Z1231 Encounter for screening mammogram for malignant neoplasm of breast: Secondary | ICD-10-CM

## 2024-03-02 ENCOUNTER — Ambulatory Visit
Admission: RE | Admit: 2024-03-02 | Discharge: 2024-03-02 | Disposition: A | Discharge status: Home / Self Care | Source: Ambulatory Visit | Attending: Family Medicine | Admitting: Family Medicine

## 2024-03-02 DIAGNOSIS — Z1231 Encounter for screening mammogram for malignant neoplasm of breast: Secondary | ICD-10-CM | POA: Diagnosis not present

## 2024-04-20 DIAGNOSIS — G4733 Obstructive sleep apnea (adult) (pediatric): Secondary | ICD-10-CM | POA: Diagnosis not present

## 2024-04-27 DIAGNOSIS — K219 Gastro-esophageal reflux disease without esophagitis: Secondary | ICD-10-CM | POA: Diagnosis not present

## 2024-04-27 DIAGNOSIS — R058 Other specified cough: Secondary | ICD-10-CM | POA: Diagnosis not present

## 2024-04-27 DIAGNOSIS — J300 Vasomotor rhinitis: Secondary | ICD-10-CM | POA: Diagnosis not present
